# Patient Record
Sex: Female | Born: 1960 | Race: White | Hispanic: No | Marital: Married | Smoking: Current every day smoker
Health system: Southern US, Community
[De-identification: ages and names within clinical notes are randomized; demographics above are authoritative.]

## PROBLEM LIST (undated history)

## (undated) DIAGNOSIS — Z86010 Personal history of colonic polyps: Secondary | ICD-10-CM

## (undated) DIAGNOSIS — N390 Urinary tract infection, site not specified: Secondary | ICD-10-CM

## (undated) DIAGNOSIS — E785 Hyperlipidemia, unspecified: Secondary | ICD-10-CM

## (undated) DIAGNOSIS — B019 Varicella without complication: Secondary | ICD-10-CM

## (undated) DIAGNOSIS — F419 Anxiety disorder, unspecified: Secondary | ICD-10-CM

## (undated) HISTORY — PX: TUBAL LIGATION: SHX77

## (undated) HISTORY — PX: COLONOSCOPY W/ POLYPECTOMY: SHX1380

## (undated) HISTORY — DX: Personal history of colonic polyps: Z86.010

## (undated) HISTORY — PX: WISDOM TOOTH EXTRACTION: SHX21

## (undated) HISTORY — DX: Anxiety disorder, unspecified: F41.9

## (undated) HISTORY — DX: Hyperlipidemia, unspecified: E78.5

## (undated) HISTORY — DX: Urinary tract infection, site not specified: N39.0

## (undated) HISTORY — PX: OTHER SURGICAL HISTORY: SHX169

## (undated) HISTORY — DX: Varicella without complication: B01.9

---

## 1999-07-14 ENCOUNTER — Encounter: Payer: Self-pay | Admitting: Obstetrics and Gynecology

## 1999-07-14 ENCOUNTER — Encounter: Admission: RE | Admit: 1999-07-14 | Discharge: 1999-07-14 | Payer: Self-pay | Admitting: Obstetrics and Gynecology

## 2005-03-16 ENCOUNTER — Encounter: Admission: RE | Admit: 2005-03-16 | Discharge: 2005-03-16 | Payer: Self-pay | Admitting: Obstetrics and Gynecology

## 2011-07-02 ENCOUNTER — Encounter: Payer: Self-pay | Admitting: Internal Medicine

## 2011-07-02 ENCOUNTER — Ambulatory Visit (INDEPENDENT_AMBULATORY_CARE_PROVIDER_SITE_OTHER): Payer: 59 | Admitting: Internal Medicine

## 2011-07-02 VITALS — BP 128/80 | HR 82 | Temp 98.0°F | Wt 137.0 lb

## 2011-07-02 DIAGNOSIS — Z0000 Encounter for general adult medical examination without abnormal findings: Secondary | ICD-10-CM

## 2011-07-02 NOTE — Progress Notes (Signed)
  Subjective:    Patient ID: Sheila Harrington, female    DOB: [birth year], 50 y.o.   MRN: [id number]  HPI Sheila Harrington presents to establish for on-going continuity. Her chief concern is that she had an NMR lipoprofile and she needs this to be interpreted. She is otherwise feeling well. She has not had any major medical illness, injury or other acute problems.  Past Medical History  Diagnosis Date  . Varicella   . Chronic UTI    Past Surgical History  Procedure Date  . Tubal ligation   . G2p2    Family History  Problem Relation Age of Onset  . Cancer Mother     breast; thyroid  . Stroke Mother   . Hyperlipidemia Mother   . Diabetes Neg Hx   . Heart disease Neg Hx    History   Social History  . Marital Status: Married    Spouse Name: N/A    Number of Children: 2  . Years of Education: 12   Occupational History  . medicaid worker    Social History Main Topics  . Smoking status: Current Everyday Smoker -- 0.5 packs/day for 10 years  . Smokeless tobacco: Not on file   Comment: advised taht it is bad  . Alcohol Use: No  . Drug Use: No  . Sexually Active: Yes -- Female partner(s)   Other Topics Concern  . Not on file   Social History Narrative   HSG, on the job training. Married - 1986 - 2 dtrs' 89, '93. 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 Marriage is in good health       Review of Systems Constitutional:  Negative for fever, chills, activity change and unexpected weight change.  HEENT:  Negative for hearing loss, ear pain, congestion, neck stiffness and postnasal drip. Negative for sore throat or swallowing problems. Negative for dental complaints.   Eyes: Negative for vision loss or change in visual acuity.  Respiratory: Negative for chest tightness and wheezing. Negative for DOE.   Cardiovascular: Negative for chest pain or palpitations. No decreased exercise tolerance Gastrointestinal: No change in bowel habit. No bloating or gas. No reflux  or indigestion Genitourinary: Negative for urgency, frequency, flank pain and difficulty urinating.  Musculoskeletal: Negative for myalgias, back pain, arthralgias and gait problem.  Neurological: Negative for dizziness, tremors, weakness and headaches.  Hematological: Negative for adenopathy.  Psychiatric/Behavioral: Negative for behavioral problems and dysphoric mood.      Objective:   Physical Exam Vitals reviewed - normal with excellent blood pressure Gen'l - well nourished and developed white woman in no distress HEENT- C&S clear - oropharynx normal Chest - good breath sounds Cor - 2+ radial pulse, RRR no murmurs Abdomen - BS+, Soft. Extremities - normal Neuro - CN II-XII grossly intact, normal gait.       Assessment & Plan:

## 2011-07-04 DIAGNOSIS — Z0000 Encounter for general adult medical examination without abnormal findings: Secondary | ICD-10-CM | POA: Insufficient documentation

## 2011-07-04 NOTE — Assessment & Plan Note (Addendum)
History is unremarkable except for concern about cholesterol. On the lipo profile the HDL is normal and the LDL is low. The remainder is not consequential in the setting of normal main indices in a patient with low to moderate risk. She is current with her gynecologist. She is current with mammography. She is a candidate for routine colonoscopy or if done she will need to forward a copy of the report. She has had recent outside labs- she will obtain copies and additional labs will be ordered as needed.  In summary - a very nice woman who seems medically stable. She will stop simvastatin and her lipid profile will be rechecked in 6 months to insure normal readings. She is advised that if her PAP smears have been normal that she can safely have follow-up pelvic and PAP at a 2 or even 3 year interval. She will return at her convenience for a more thorough exam. She is oriented to the practice and processes. All questions answered.

## 2011-08-23 ENCOUNTER — Telehealth: Payer: Self-pay | Admitting: *Deleted

## 2011-08-23 DIAGNOSIS — N3000 Acute cystitis without hematuria: Secondary | ICD-10-CM

## 2011-08-23 NOTE — Telephone Encounter (Signed)
Pt informed

## 2011-08-23 NOTE — Telephone Encounter (Signed)
Pt c/o having "bad kidney infection"; requesting Bactrim Rx.

## 2011-08-23 NOTE — Telephone Encounter (Signed)
Come down now for stat u/a - 595.0. Treatment to be based on results

## 2011-08-31 ENCOUNTER — Other Ambulatory Visit (INDEPENDENT_AMBULATORY_CARE_PROVIDER_SITE_OTHER): Payer: 59

## 2011-08-31 DIAGNOSIS — N3000 Acute cystitis without hematuria: Secondary | ICD-10-CM

## 2011-08-31 LAB — URINALYSIS, ROUTINE W REFLEX MICROSCOPIC
Bilirubin Urine: NEGATIVE
Hgb urine dipstick: NEGATIVE
Ketones, ur: NEGATIVE
Leukocytes, UA: NEGATIVE
Nitrite: NEGATIVE
Specific Gravity, Urine: <=1.005 (ref 1.000–1.030)
Total Protein, Urine: NEGATIVE
Urine Glucose: NEGATIVE
Urobilinogen, UA: 0.2 (ref 0.0–1.0)
pH: 7 (ref 5.0–8.0)

## 2011-08-31 NOTE — Telephone Encounter (Signed)
Pt informed U/A clear.

## 2012-01-25 ENCOUNTER — Other Ambulatory Visit: Payer: Self-pay

## 2012-01-25 DIAGNOSIS — Z13220 Encounter for screening for lipoid disorders: Secondary | ICD-10-CM

## 2012-02-01 ENCOUNTER — Other Ambulatory Visit (INDEPENDENT_AMBULATORY_CARE_PROVIDER_SITE_OTHER): Payer: 59

## 2012-02-01 DIAGNOSIS — Z13220 Encounter for screening for lipoid disorders: Secondary | ICD-10-CM

## 2012-02-01 LAB — LIPID PANEL
Cholesterol: 226 mg/dL — ABNORMAL HIGH (ref 0–200)
HDL: 42.6 mg/dL (ref >39.00)
Total CHOL/HDL Ratio: 5
Triglycerides: 90 mg/dL (ref 0.0–149.0)
VLDL: 18 mg/dL (ref 0.0–40.0)

## 2012-02-01 LAB — LDL CHOLESTEROL, DIRECT: Direct LDL: 177.3 mg/dL

## 2012-02-11 ENCOUNTER — Encounter: Payer: Self-pay | Admitting: Internal Medicine

## 2012-02-18 ENCOUNTER — Telehealth: Payer: Self-pay

## 2012-02-18 MED ORDER — SIMVASTATIN 20 MG PO TABS: 20.0000 mg | ORAL_TABLET | Freq: Every day | ORAL | Status: DC

## 2012-02-18 MED ORDER — ZOLPIDEM TARTRATE 5 MG PO TABS: 5.0000 mg | ORAL_TABLET | Freq: Every evening | ORAL | Status: DC | PRN

## 2012-02-18 NOTE — Telephone Encounter (Signed)
1. For sleep - zolpidem 5 mg qhs every 2nd or third night #20. For continued problems - OV 2. Last LDL 177 - did she receive letter? (5/27)  Has she resumed zocor as instructed in that letter.

## 2012-02-18 NOTE — Telephone Encounter (Signed)
Pt called requesting Rx to help with sleep. Pt states recent increase of work related stress is the cause, please advise.

## 2012-02-18 NOTE — Telephone Encounter (Signed)
Notified pt with md response & recommendations. Called ambien into [facility], already sent zocor... 02/18/12@2 :02pm/LMB

## 2012-04-14 ENCOUNTER — Other Ambulatory Visit: Payer: Self-pay | Admitting: Internal Medicine

## 2012-04-15 NOTE — Telephone Encounter (Signed)
Rx printed and to be signed and faxed for [employer]

## 2012-04-28 ENCOUNTER — Encounter: Payer: Self-pay | Admitting: Internal Medicine

## 2012-07-28 ENCOUNTER — Telehealth: Payer: Self-pay

## 2012-07-28 NOTE — Telephone Encounter (Signed)
A user error has taken place: encounter opened in error, closed for administrative reasons.

## 2012-08-20 ENCOUNTER — Other Ambulatory Visit: Payer: Self-pay | Admitting: Internal Medicine

## 2012-10-08 ENCOUNTER — Other Ambulatory Visit (INDEPENDENT_AMBULATORY_CARE_PROVIDER_SITE_OTHER): Payer: PRIVATE HEALTH INSURANCE

## 2012-10-08 ENCOUNTER — Ambulatory Visit (INDEPENDENT_AMBULATORY_CARE_PROVIDER_SITE_OTHER): Payer: PRIVATE HEALTH INSURANCE | Admitting: Internal Medicine

## 2012-10-08 ENCOUNTER — Encounter: Payer: Self-pay | Admitting: Internal Medicine

## 2012-10-08 VITALS — BP 110/72 | HR 96 | Temp 97.6°F | Resp 10 | Ht 65.0 in | Wt 140.1 lb

## 2012-10-08 DIAGNOSIS — Z0000 Encounter for general adult medical examination without abnormal findings: Secondary | ICD-10-CM

## 2012-10-08 DIAGNOSIS — E785 Hyperlipidemia, unspecified: Secondary | ICD-10-CM | POA: Insufficient documentation

## 2012-10-08 DIAGNOSIS — Z1211 Encounter for screening for malignant neoplasm of colon: Secondary | ICD-10-CM

## 2012-10-08 LAB — LIPID PANEL
Cholesterol: 157 mg/dL (ref 0–200)
HDL: 41.4 mg/dL (ref >39.00)
VLDL: 19.4 mg/dL (ref 0.0–40.0)

## 2012-10-08 LAB — COMPREHENSIVE METABOLIC PANEL
Alkaline Phosphatase: 53 U/L (ref 39–117)
BUN: 11 mg/dL (ref 6–23)
Glucose, Bld: 91 mg/dL (ref 70–99)
Total Bilirubin: 0.8 mg/dL (ref 0.3–1.2)

## 2012-10-08 LAB — HEPATIC FUNCTION PANEL
ALT: 18 U/L (ref 0–35)
AST: 19 U/L (ref 0–37)
Bilirubin, Direct: 0.1 mg/dL (ref 0.0–0.3)
Total Bilirubin: 0.8 mg/dL (ref 0.3–1.2)

## 2012-10-08 NOTE — Progress Notes (Signed)
Subjective:    Patient ID: Sheila Harrington, female    DOB: 1961/08/06, 52 y.o.   MRN: [id number]  HPI Sheila Harrington presents for a well woman exam. She had a PAP in '13 and is due '16. She had a mammogram in late '13. She has had tetanus/Tdap and flu shot. She is due for routine dental exam. She had eye exam last year. Exercise - will walk at work for 30 minutes in good weather.  Past Medical History  Diagnosis Date  . Varicella   . Chronic UTI    Past Surgical History  Procedure Date  . Tubal ligation   . G2p2    Family History  Problem Relation Age of Onset  . Cancer Mother     breast; thyroid  . Stroke Mother   . Hyperlipidemia Mother   . Diabetes Neg Hx   . Heart disease Neg Hx    History   Social History  . Marital Status: Married    Spouse Name: N/A    Number of Children: 2  . Years of Education: 12   Occupational History  . medicaid worker    Social History Main Topics  . Smoking status: Current Every Day Smoker -- 0.5 packs/day for 10 years  . Smokeless tobacco: Not on file     Comment: advised taht it is bad  . Alcohol Use: No  . Drug Use: No  . Sexually Active: Yes -- Female partner(s)   Other Topics Concern  . Not on file   Social History Narrative   HSG, on the job training. Married - 1986 - 2 dtrs' 89, '93. 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 Marriage is in good health    Current Outpatient Prescriptions on File Prior to Visit  Medication Sig Dispense Refill  . AMBIEN 5 MG tablet TAKE 1 TABLET BY MOUTH AT BEDTIME AS NEEDED FOR SLEEP  30 each  3  . simvastatin (ZOCOR) 20 MG tablet TAKE 1 TABLET BY MOUTH AT BEDTIME.  30 tablet  2      Review of Systems Constitutional:  Negative for fever, chills, activity change and unexpected weight change.  HEENT:  Negative for hearing loss, ear pain, congestion, neck stiffness and postnasal drip. Negative for sore throat or swallowing problems. Negative for dental complaints.   Eyes:  Negative for vision loss or change in visual acuity.  Respiratory: Negative for chest tightness and wheezing. Negative for DOE.   Cardiovascular: Negative for chest pain or palpitations. No decreased exercise tolerance Gastrointestinal: No change in bowel habit. No bloating or gas. No reflux or indigestion Genitourinary: Negative for urgency, frequency, flank pain and difficulty urinating.  Musculoskeletal: Negative for myalgias, back pain, arthralgias and gait problem.  Neurological: Negative for dizziness, tremors, weakness and headaches.  Hematological: Negative for adenopathy.  Psychiatric/Behavioral: Negative for behavioral problems and dysphoric mood.       Objective:   Physical Exam Filed Vitals:   10/08/12 1317  BP: 110/72  Pulse: 96  Temp: 97.6 F (36.4 C)  Resp: 10   Wt Readings from Last 3 Encounters:  10/08/12 140 lb 1.3 oz (63.54 kg)  07/02/11 137 lb (62.143 kg)   Gen'l: well nourished, well developed white Woman in no distress HEENT - [place]/AT, EACs/TMs normal, oropharynx with native dentition in good condition, no buccal or palatal lesions, posterior pharynx clear, mucous membranes moist. C&S clear, PERRLA, fundi - normal Neck - supple, no thyromegaly Nodes- negative submental, cervical, supraclavicular regions Chest -  no deformity, no CVAT Lungs - clear without rales, wheezes. No increased work of breathing Breast - - Skin normal, nipples w/o discharge, no fixed mass or lesion, no axillary adenopathy. Cardiovascular - regular rate and rhythm, quiet precordium, no murmurs, rubs or gallops, 2+ radial, DP and PT pulses Abdomen - BS+ x 4, no HSM, no guarding or rebound or tenderness Pelvic - deferred to gyn Rectal - deferred to gyn Extremities - no clubbing, cyanosis, edema or deformity.  Neuro - A&O x 3, CN II-XII normal, motor strength normal and equal, DTRs 2+ and symmetrical biceps, radial, and patellar tendons. Cerebellar - no tremor, no rigidity, fluid movement  and normal gait. Derm - Head, neck, back, abdomen and extremities without suspicious lesions  Lab Results  Component Value Date   GLUCOSE 91 10/08/2012   CHOL 157 10/08/2012   TRIG 97.0 10/08/2012   HDL 41.40 10/08/2012   LDLDIRECT 177.3 02/01/2012   LDLCALC 96 10/08/2012        ALT 18 10/08/2012   AST 19 10/08/2012        NA 138 10/08/2012   K 3.9 10/08/2012   CL 104 10/08/2012   CREATININE 0.7 10/08/2012   BUN 11 10/08/2012   CO2 26 10/08/2012         Assessment & Plan:

## 2012-10-08 NOTE — Patient Instructions (Addendum)
Thanks for coming to see me.  Stress relief: exercise is really good; the old radiator hose and the phone book really works. For stress that is interfering with the quality of your life and relationships counseling may help.  You will receive a full report including labs and you may check lab results on MyChart.  See you next year.

## 2012-10-10 ENCOUNTER — Encounter: Payer: Self-pay | Admitting: Internal Medicine

## 2012-10-11 NOTE — Assessment & Plan Note (Signed)
Interval history - no serious medical illness, injury or surgery. Physical exam - normal. She is due and will be scheduled for colorectal cancer screening. She is current with breast cancer screening. Tdap recommended  In summary - a very pleasant woman who is medically stable. She will return for routine exam in 1 year.

## 2012-10-11 NOTE — Assessment & Plan Note (Signed)
Lipid panel demonstrating great control: LDL down from 177 to 96. Liver functions are normal.  Plan   continue simvastatin.

## 2012-10-24 ENCOUNTER — Encounter: Payer: Self-pay | Admitting: Internal Medicine

## 2012-10-24 ENCOUNTER — Ambulatory Visit (AMBULATORY_SURGERY_CENTER): Payer: PRIVATE HEALTH INSURANCE

## 2012-10-24 VITALS — Ht 65.0 in | Wt 145.0 lb

## 2012-10-24 DIAGNOSIS — Z1211 Encounter for screening for malignant neoplasm of colon: Secondary | ICD-10-CM

## 2012-10-24 MED ORDER — NA SULFATE-K SULFATE-MG SULF 17.5-3.13-1.6 GM/177ML PO SOLN: 1.0000 | Freq: Once | ORAL | Status: DC

## 2012-11-11 ENCOUNTER — Telehealth: Payer: Self-pay | Admitting: Internal Medicine

## 2012-11-11 NOTE — Telephone Encounter (Signed)
Spoke with patient and informed her that [employer] would have to order the Suprep bowel prep and she could pick it up tomorrow, or she could come by our office and pick up the sample of Suprep  today. The patient's procedure is scheduled for 11/13/12. The patient will send her daughter to pick up the Suprep prep today due to the patient lives in [place] and unable to be here before we close.She will call if she has further questions.

## 2012-11-13 ENCOUNTER — Encounter: Payer: Self-pay | Admitting: Internal Medicine

## 2012-11-13 ENCOUNTER — Ambulatory Visit (AMBULATORY_SURGERY_CENTER): Payer: PRIVATE HEALTH INSURANCE | Admitting: Internal Medicine

## 2012-11-13 VITALS — BP 116/77 | HR 79 | Temp 97.8°F | Resp 16 | Ht 65.0 in | Wt 145.0 lb

## 2012-11-13 DIAGNOSIS — K5730 Diverticulosis of large intestine without perforation or abscess without bleeding: Secondary | ICD-10-CM

## 2012-11-13 DIAGNOSIS — Z1211 Encounter for screening for malignant neoplasm of colon: Secondary | ICD-10-CM

## 2012-11-13 DIAGNOSIS — D126 Benign neoplasm of colon, unspecified: Secondary | ICD-10-CM

## 2012-11-13 MED ORDER — SODIUM CHLORIDE 0.9 % IV SOLN: 500.0000 mL | INTRAVENOUS | Status: DC

## 2012-11-13 NOTE — Patient Instructions (Addendum)
I found and removed 3 polyps from the colon and rectum today. They all look benign - i will send them to pathology to find out for sure.  You also have diverticulosis - a common condition not usually a problem.  I will let you know pathology results and when to have another routine colonoscopy by mail.  Thank you for choosing me and [facility].  Iva Boop, MD, FACG  YOU HAD AN ENDOSCOPIC PROCEDURE TODAY AT THE [facility]: Refer to the procedure report that was given to you for any specific questions about what was found during the examination.  If the procedure report does not answer your questions, please call your gastroenterologist to clarify.  If you requested that your care partner not be given the details of your procedure findings, then the procedure report has been included in a sealed envelope for you to review at your convenience later.  YOU SHOULD EXPECT: Some feelings of bloating in the abdomen. Passage of more gas than usual.  Walking can help get rid of the air that was put into your GI tract during the procedure and reduce the bloating. If you had a lower endoscopy (such as a colonoscopy or flexible sigmoidoscopy) you may notice spotting of blood in your stool or on the toilet paper. If you underwent a bowel prep for your procedure, then you may not have a normal bowel movement for a few days.  DIET: Your first meal following the procedure should be a light meal and then it is ok to progress to your normal diet.  A half-sandwich or bowl of soup is an example of a good first meal.  Heavy or fried foods are harder to digest and may make you feel nauseous or bloated.  Likewise meals heavy in dairy and vegetables can cause extra gas to form and this can also increase the bloating.  Drink plenty of fluids but you should avoid alcoholic beverages for 24 hours.  ACTIVITY: Your care partner should take you home directly after the procedure.  You should plan to  take it easy, moving slowly for the rest of the day.  You can resume normal activity the day after the procedure however you should NOT DRIVE or use heavy machinery for 24 hours (because of the sedation medicines used during the test).    SYMPTOMS TO REPORT IMMEDIATELY: A gastroenterologist can be reached at any hour.  During normal business hours, 8:30 AM to 5:00 PM Monday through Friday, call [phone].  After hours and on weekends, please call the GI answering service at [phone] who will take a message and have the physician on call contact you.   Following lower endoscopy (colonoscopy or flexible sigmoidoscopy):  Excessive amounts of blood in the stool  Significant tenderness or worsening of abdominal pains  Swelling of the abdomen that is new, acute  Fever of 100F or higher  FOLLOW UP: If any biopsies were taken you will be contacted by phone or by letter within the next 1-3 weeks.  Call your gastroenterologist if you have not heard about the biopsies in 3 weeks.  Our staff will call the home number listed on your records the next business day following your procedure to check on you and address any questions or concerns that you may have at that time regarding the information given to you following your procedure. This is a courtesy call and so if there is no answer at the home number and we have  not heard from you through the emergency physician on call, we will assume that you have returned to your regular daily activities without incident.  SIGNATURES/CONFIDENTIALITY: You and/or your care partner have signed paperwork which will be entered into your electronic medical record.  These signatures attest to the fact that that the information above on your After Visit Summary has been reviewed and is understood.  Full responsibility of the confidentiality of this discharge information lies with you and/or your care-partner.  Polyps-handout given  Diverticulosis-handout  given  Repeat colonoscopy will be determined by pathology  Hold aspirin, aspirin products, and anti-inflammatory (advil, motrin, Ibuprofen, naproxen) medication for 1 week

## 2012-11-13 NOTE — Op Note (Signed)
[facility] [address] [place], [address]   COLONOSCOPY PROCEDURE REPORT  PATIENT: Sheila, Harrington  MR#: [id number] BIRTHDATE: 10-Oct-1960 , 51  yrs. old GENDER: Female ENDOSCOPIST: Iva Boop, MD, [facility] REFERRED XB:[id number] Esther Hardy, M.D. PROCEDURE DATE:  11/13/2012 PROCEDURE:   Colonoscopy with snare polypectomy ASA CLASS:   Class I INDICATIONS:average risk screening. MEDICATIONS: propofol (Diprivan) 300mg  IV, MAC sedation, administered by CRNA, and These medications were titrated to patient response per physician's verbal order  DESCRIPTION OF PROCEDURE:   After the risks benefits and alternatives of the procedure were thoroughly explained, informed consent was obtained.  A digital rectal exam revealed no abnormalities of the rectum.   The LB [id number]  endoscope was introduced through the anus and advanced to the cecum, which was identified by both the appendix and ileocecal valve. No adverse events experienced.   The quality of the prep was Suprep good  The instrument was then slowly withdrawn as the colon was fully examined.      COLON FINDINGS: A polypoid shaped and smooth sessile polyp measuring 1 cm in size was found in the sigmoid colon.  A polypectomy was performed using snare cautery.  The resection was complete and the polyp tissue was completely retrieved.   Two diminutive polypoid shaped and smooth sessile polyps were found.  A polypectomy was performed with a cold snare.  The resection was complete and the polyp tissue was completely retrieved.   There was severe diverticulosis noted in the sigmoid colon with associated muscular hypertrophy and luminal narrowing.   Mild diverticulosis was noted in the ascending colon.   The colon mucosa was otherwise normal. A right colon retroflexion was performed.  Retroflexed views revealed no abnormalities. The time to cecum=4 minutes 70 seconds. Withdrawal time=12 minutes 18 seconds.   The scope was withdrawn and the procedure completed. COMPLICATIONS: There were no complications.  ENDOSCOPIC IMPRESSION: 1.   Sessile polyp measuring 1 cm in size was found in the sigmoid colon; polypectomy was performed using snare cautery 2.   Two diminutive sessile polyps were found; polypectomy was performed with a cold snare 3.   There was severe diverticulosis noted in the sigmoid colon 4.   Mild diverticulosis was noted in the ascending colon 5.   The colon mucosa was otherwise normal - good prep     RECOMMENDATIONS: 1.  Timing of repeat colonoscopy will be determined by pathology findings. 2.  Hold aspirin, aspirin products, and anti-inflammatory medication for 1 week.   eSigned:  Iva Boop, MD, [facility] 11/13/2012 10:49 AM   cc: Jacques Navy, MD and The Patient   PATIENT NAME:  Sheila, Harrington MR#: [id number]

## 2012-11-13 NOTE — Progress Notes (Signed)
Patient did not experience any of the following events: a burn prior to discharge; a fall within the facility; wrong site/side/patient/procedure/implant event; or a hospital transfer or hospital admission upon discharge from the facility. (G8907) Patient did not have preoperative order for IV antibiotic SSI prophylaxis. (G8918)  

## 2012-11-13 NOTE — Progress Notes (Signed)
Called to room to assist during endoscopic procedure.  Patient ID and intended procedure confirmed with present staff. Received instructions for my participation in the procedure from the performing physician.  

## 2012-11-14 ENCOUNTER — Telehealth: Payer: Self-pay | Admitting: *Deleted

## 2012-11-14 NOTE — Telephone Encounter (Signed)
Left message that we called for f/u 

## 2012-11-19 ENCOUNTER — Other Ambulatory Visit: Payer: Self-pay | Admitting: Internal Medicine

## 2012-11-20 ENCOUNTER — Encounter: Payer: Self-pay | Admitting: Internal Medicine

## 2012-11-20 DIAGNOSIS — Z860100 Personal history of colon polyps, unspecified: Secondary | ICD-10-CM

## 2012-11-20 HISTORY — DX: Personal history of colon polyps, unspecified: Z86.0100

## 2012-11-20 HISTORY — DX: Personal history of colonic polyps: Z86.010

## 2012-11-20 NOTE — Progress Notes (Signed)
Quick Note:  1 cm sigmoid adenoma 2 rectal hyperplastic polyps Repeat colon 3 yrs 11/2015 ______

## 2012-12-30 ENCOUNTER — Other Ambulatory Visit: Payer: Self-pay | Admitting: Dermatology

## 2013-02-23 ENCOUNTER — Telehealth: Payer: Self-pay | Admitting: *Deleted

## 2013-02-23 NOTE — Telephone Encounter (Signed)
Notified pt wit md response. Pt states she will call back once she get her schedule & arrange appt...lmb

## 2013-02-23 NOTE — Telephone Encounter (Signed)
Will need office visit

## 2013-02-23 NOTE — Telephone Encounter (Signed)
Left msg on triage requesting md to rx hormone replacement...Sheila Harrington

## 2013-04-15 ENCOUNTER — Other Ambulatory Visit: Payer: Self-pay | Admitting: Internal Medicine

## 2013-04-15 NOTE — Telephone Encounter (Signed)
Zolpidem called to pharmacy  

## 2013-04-17 DEATH — deceased

## 2013-07-15 ENCOUNTER — Encounter (INDEPENDENT_AMBULATORY_CARE_PROVIDER_SITE_OTHER): Payer: Self-pay

## 2013-07-15 ENCOUNTER — Encounter: Payer: Self-pay | Admitting: Nurse Practitioner

## 2013-07-15 ENCOUNTER — Ambulatory Visit (INDEPENDENT_AMBULATORY_CARE_PROVIDER_SITE_OTHER): Payer: PRIVATE HEALTH INSURANCE | Admitting: Nurse Practitioner

## 2013-07-15 VITALS — BP 133/84 | HR 91 | Temp 98.5°F | Ht 64.0 in | Wt 146.0 lb

## 2013-07-15 DIAGNOSIS — Z124 Encounter for screening for malignant neoplasm of cervix: Secondary | ICD-10-CM

## 2013-07-15 DIAGNOSIS — Z23 Encounter for immunization: Secondary | ICD-10-CM

## 2013-07-15 DIAGNOSIS — Z0000 Encounter for general adult medical examination without abnormal findings: Secondary | ICD-10-CM

## 2013-07-15 DIAGNOSIS — Z01419 Encounter for gynecological examination (general) (routine) without abnormal findings: Secondary | ICD-10-CM

## 2013-07-15 LAB — POCT UA - MICROSCOPIC ONLY
Casts, Ur, LPF, POC: NEGATIVE
Crystals, Ur, HPF, POC: NEGATIVE

## 2013-07-15 LAB — POCT CBC
Granulocyte percent: 56.9 %G (ref 37–80)
Lymph, poc: 3.3 (ref 0.6–3.4)
MCH, POC: 29 pg (ref 27–31.2)
MCV: 88.7 fL (ref 80–97)
POC Granulocyte: 4.5 (ref 2–6.9)
Platelet Count, POC: 214 10*3/uL (ref 142–424)
RBC: 4.7 M/uL (ref 4.04–5.48)
RDW, POC: 12.5 %
WBC: 7.9 10*3/uL (ref 4.6–10.2)

## 2013-07-15 LAB — POCT URINALYSIS DIPSTICK
Bilirubin, UA: NEGATIVE
Ketones, UA: NEGATIVE
Protein, UA: NEGATIVE
Spec Grav, UA: 1.02
pH, UA: 6

## 2013-07-15 MED ORDER — ESCITALOPRAM OXALATE 10 MG PO TABS: 10.0000 mg | ORAL_TABLET | Freq: Every day | ORAL | Status: DC

## 2013-07-15 MED ORDER — ZOLPIDEM TARTRATE 5 MG PO TABS: 5.0000 mg | ORAL_TABLET | Freq: Every evening | ORAL | Status: DC | PRN

## 2013-07-15 NOTE — Progress Notes (Signed)
Subjective:    Patient ID: Sheila Harrington, female    DOB: 10/15/1960, 52 y.o.   MRN: [id number]  HPI Patient in today for CPE and PAP- SHE is doing well today- Only complaint is stress at work wit new computer system. Patient Active Problem List   Diagnosis Date Noted  . Personal history of colonic adenoma 11/20/2012  . Hyperlipidemia 10/08/2012  . Routine health maintenance 07/04/2011   Outpatient Encounter Prescriptions as of 07/15/2013  Medication Sig Dispense Refill  . simvastatin (ZOCOR) 20 MG tablet TAKE 1 TABLET BY MOUTH AT BEDTIME.  30 tablet  9  . zolpidem (AMBIEN) 5 MG tablet TAKE 1 TABLET BY MOUTH AT BEDTIME AS NEEDED FOR SLEEP  30 tablet  5   No facility-administered encounter medications on file as of 07/15/2013.       Review of Systems  Constitutional: Negative.   HENT: Negative.   Eyes: Negative.   Respiratory: Negative.   Cardiovascular: Negative.   Gastrointestinal: Negative.   Genitourinary: Negative.   Musculoskeletal: Negative.   Neurological: Negative.   Hematological: Negative.   Psychiatric/Behavioral: Negative.   All other systems reviewed and are negative.       Objective:   Physical Exam  Constitutional: She is oriented to person, place, and time. She appears well-developed and well-nourished.  HENT:  Head: Normocephalic.  Right Ear: Hearing, tympanic membrane, external ear and ear canal normal.  Left Ear: Hearing, tympanic membrane, external ear and ear canal normal.  Nose: Nose normal.  Mouth/Throat: Uvula is midline and oropharynx is clear and moist.  Eyes: Conjunctivae and EOM are normal. Pupils are equal, round, and reactive to light.  Neck: Normal range of motion and full passive range of motion without pain. Neck supple. No JVD present. Carotid bruit is not present. No mass and no thyromegaly present.  Cardiovascular: Normal rate, normal heart sounds and intact distal pulses.   No murmur heard. Pulmonary/Chest: Effort normal and  breath sounds normal. Right breast exhibits no inverted nipple, no mass, no nipple discharge, no skin change and no tenderness. Left breast exhibits no inverted nipple, no mass, no nipple discharge, no skin change and no tenderness.  Abdominal: Soft. Bowel sounds are normal. She exhibits no mass. There is no tenderness.  Genitourinary: Vagina normal and uterus normal. No breast swelling, tenderness, discharge or bleeding.  bimanual exam-No adnexal masses or tenderness. Cervix parous and pink- no discharge  Musculoskeletal: Normal range of motion.  Lymphadenopathy:    She has no cervical adenopathy.  Neurological: She is alert and oriented to person, place, and time.  Skin: Skin is warm and dry.  Psychiatric: She has a normal mood and affect. Her behavior is normal. Judgment and thought content normal.  BP 133/84  Pulse 91  Temp(Src) 98.5 F (36.9 C) (Oral)  Ht 5\' 4"  (1.626 m)  Wt 146 lb (66.225 kg)  BMI 25.05 kg/m2 Results for orders placed in visit on 07/15/13  POCT UA - MICROSCOPIC ONLY      Result Value Range   WBC, Ur, HPF, POC 4-7     RBC, urine, microscopic 5-10     Bacteria, U Microscopic many     Mucus, UA mod     Epithelial cells, urine per micros few     Crystals, Ur, HPF, POC neg     Casts, Ur, LPF, POC neg     Yeast, UA neg    POCT URINALYSIS DIPSTICK      Result Value Range   Color,  UA yellow     Clarity, UA clear     Glucose, UA neg     Bilirubin, UA neg     Ketones, UA neg     Spec Grav, UA 1.020     Blood, UA trace     pH, UA 6.0     Protein, UA neg     Urobilinogen, UA negative     Nitrite, UA +     Leukocytes, UA Trace             Assessment & Plan:   1. Encounter for routine gynecological examination   2. Annual physical exam    Orders Placed This Encounter  Procedures  . CMP14+EGFR  . NMR, lipoprofile  . Thyroid Panel With TSH  . POCT UA - Microscopic Only  . POCT urinalysis dipstick  . POCT CBC   Meds ordered this encounter   Medications  . zolpidem (AMBIEN) 5 MG tablet    Sig: Take 1 tablet (5 mg total) by mouth at bedtime as needed for sleep.    Dispense:  30 tablet    Refill:  2    Order Specific Question:  Supervising Provider    Answer:  Ernestina Penna [[id number]]  . escitalopram (LEXAPRO) 10 MG tablet    Sig: Take 1 tablet (10 mg total) by mouth daily.    Dispense:  30 tablet    Refill:  5    Order Specific Question:  Supervising Provider    Answer:  Deborra Medina   Added lexapro to meds Stress management discussed Continue all meds Labs pending Diet and exercise encouraged Health maintenance reviewed Follow up in 6 months  Mary-Margaret Daphine Deutscher, FNP

## 2013-07-15 NOTE — Addendum Note (Signed)
Addended by: Roselyn Reef on: 07/15/2013 05:25 PM   Modules accepted: Orders

## 2013-07-15 NOTE — Patient Instructions (Addendum)
Stress Management Stress is a state of physical or mental tension that often results from changes in your life or normal routine. Some common causes of stress are:  Death of a loved one.  Injuries or severe illnesses.  Getting fired or changing jobs.  Moving into a new home. Other causes may be:  Sexual problems.  Business or financial losses.  Taking on a large debt.  Regular conflict with someone at home or at work.  Constant tiredness from lack of sleep. It is not just bad things that are stressful. It may be stressful to:  Win the lottery.  Get married.  Buy a new car. The amount of stress that can be easily tolerated varies from person to person. Changes generally cause stress, regardless of the types of change. Too much stress can affect your health. It may lead to physical or emotional problems. Too little stress (boredom) may also become stressful. SUGGESTIONS TO REDUCE STRESS:  Talk things over with your family and friends. It often is helpful to share your concerns and worries. If you feel your problem is serious, you may want to get help from a professional counselor.  Consider your problems one at a time instead of lumping them all together. Trying to take care of everything at once may seem impossible. List all the things you need to do and then start with the most important one. Set a goal to accomplish 2 or 3 things each day. If you expect to do too many in a single day you will naturally fail, causing you to feel even more stressed.  Do not use alcohol or drugs to relieve stress. Although you may feel better for a short time, they do not remove the problems that caused the stress. They can also be habit forming.  Exercise regularly - at least 3 times per week. Physical exercise can help to relieve that "uptight" feeling and will relax you.  The shortest distance between despair and hope is often a good night's sleep.  Go to bed and get up on time allowing  yourself time for appointments without being rushed.  Take a short "time-out" period from any stressful situation that occurs during the day. Close your eyes and take some deep breaths. Starting with the muscles in your face, tense them, hold it for a few seconds, then relax. Repeat this with the muscles in your neck, shoulders, hand, stomach, back and legs.  Take good care of yourself. Eat a balanced diet and get plenty of rest.  Schedule time for having fun. Take a break from your daily routine to relax. HOME CARE INSTRUCTIONS   Call if you feel overwhelmed by your problems and feel you can no longer manage them on your own.  Return immediately if you feel like hurting yourself or someone else. Document Released: 02/27/2001 Document Revised: 11/26/2011 Document Reviewed: 10/20/2007 [employer] Patient Information 2014 [employer].  

## 2013-07-17 LAB — NMR, LIPOPROFILE
HDL Cholesterol by NMR: 41 mg/dL (ref >=40)
HDL Particle Number: 32 umol/L (ref >=30.5)
LDL Size: 20.1 nm — ABNORMAL LOW (ref >20.5)
Small LDL Particle Number: 1167 nmol/L — ABNORMAL HIGH (ref <=527)
Triglycerides by NMR: 136 mg/dL (ref <150)

## 2013-07-17 LAB — CMP14+EGFR
ALT: 13 IU/L (ref 0–32)
AST: 13 IU/L (ref 0–40)
Albumin/Globulin Ratio: 1.8 (ref 1.1–2.5)
Chloride: 102 mmol/L (ref 97–108)
Creatinine, Ser: 0.77 mg/dL (ref 0.57–1.00)
GFR calc Af Amer: 101 mL/min/{1.73_m2} (ref >59)
GFR calc non Af Amer: 88 mL/min/{1.73_m2} (ref >59)
Globulin, Total: 2.5 g/dL (ref 1.5–4.5)
Potassium: 4.5 mmol/L (ref 3.5–5.2)
Sodium: 140 mmol/L (ref 134–144)
Total Bilirubin: 0.3 mg/dL (ref 0.0–1.2)

## 2013-07-17 LAB — THYROID PANEL WITH TSH
T4, Total: 9 ug/dL (ref 4.5–12.0)
TSH: 3.02 u[IU]/mL (ref 0.450–4.500)

## 2013-07-19 LAB — URINE CULTURE

## 2013-07-20 LAB — PAP IG W/ RFLX HPV ASCU

## 2013-07-21 ENCOUNTER — Other Ambulatory Visit: Payer: Self-pay | Admitting: Nurse Practitioner

## 2013-07-21 MED ORDER — SULFAMETHOXAZOLE-TMP DS 800-160 MG PO TABS: 1.0000 | ORAL_TABLET | Freq: Two times a day (BID) | ORAL | Status: DC

## 2013-08-04 ENCOUNTER — Encounter: Payer: PRIVATE HEALTH INSURANCE | Admitting: Internal Medicine

## 2013-08-19 ENCOUNTER — Encounter: Payer: Self-pay | Admitting: Internal Medicine

## 2013-09-21 ENCOUNTER — Other Ambulatory Visit: Payer: Self-pay

## 2013-09-21 NOTE — Telephone Encounter (Signed)
Detailed message left for patient.

## 2013-09-21 NOTE — Telephone Encounter (Signed)
Last seen 07/15/13  MMM

## 2013-09-21 NOTE — Telephone Encounter (Signed)
Do not do refills on ntibiotics

## 2013-09-25 ENCOUNTER — Other Ambulatory Visit: Payer: Self-pay | Admitting: Internal Medicine

## 2013-10-12 ENCOUNTER — Ambulatory Visit (INDEPENDENT_AMBULATORY_CARE_PROVIDER_SITE_OTHER)
Admission: RE | Admit: 2013-10-12 | Discharge: 2013-10-12 | Disposition: A | Discharge status: Home / Self Care | Payer: PRIVATE HEALTH INSURANCE | Source: Ambulatory Visit | Attending: Internal Medicine | Admitting: Internal Medicine

## 2013-10-12 ENCOUNTER — Encounter: Payer: Self-pay | Admitting: Internal Medicine

## 2013-10-12 ENCOUNTER — Ambulatory Visit (INDEPENDENT_AMBULATORY_CARE_PROVIDER_SITE_OTHER): Payer: PRIVATE HEALTH INSURANCE | Admitting: Internal Medicine

## 2013-10-12 VITALS — BP 130/80 | HR 77 | Temp 97.3°F | Ht 65.5 in | Wt 145.0 lb

## 2013-10-12 DIAGNOSIS — F17200 Nicotine dependence, unspecified, uncomplicated: Secondary | ICD-10-CM

## 2013-10-12 DIAGNOSIS — Z86010 Personal history of colonic polyps: Secondary | ICD-10-CM

## 2013-10-12 DIAGNOSIS — E785 Hyperlipidemia, unspecified: Secondary | ICD-10-CM

## 2013-10-12 DIAGNOSIS — Z0000 Encounter for general adult medical examination without abnormal findings: Secondary | ICD-10-CM

## 2013-10-12 NOTE — Patient Instructions (Signed)
Thanks for coming to see me.  You seem to be doing well. The MOST IMPORTANT THING YOU CAN DO YOUR YOUR HEALTH IS TO STOP SMOKING. This is a real challenge.  Plan Smoker's diary  Develop a plan to change behavior in regard to the #1 cigarettes.  Enlist the help of others   Can prescribe medications for anxiety and craving if needed.  Thanks for allowing me to be your physician.    Smoking Cessation, Tips for Success If you are ready to quit smoking, congratulations! You have chosen to help yourself be healthier. Cigarettes bring nicotine, tar, carbon monoxide, and other irritants into your body. Your lungs, heart, and blood vessels will be able to work better without these poisons. There are many different ways to quit smoking. Nicotine gum, nicotine patches, a nicotine inhaler, or nicotine nasal spray can help with physical craving. Hypnosis, support groups, and medicines help break the habit of smoking. WHAT THINGS CAN I DO TO MAKE QUITTING EASIER?  Here are some tips to help you quit for good:  Pick a date when you will quit smoking completely. Tell all of your friends and family about your plan to quit on that date.  Do not try to slowly cut down on the number of cigarettes you are smoking. Pick a quit date and quit smoking completely starting on that day.  Throw away all cigarettes.   Clean and remove all ashtrays from your home, work, and car.   On a card, write down your reasons for quitting. Carry the card with you and read it when you get the urge to smoke.   Cleanse your body of nicotine. Drink enough water and fluids to keep your urine clear or pale yellow. Do this after quitting to flush the nicotine from your body.   Learn to predict your moods. Do not let a bad situation be your excuse to have a cigarette. Some situations in your life might tempt you into wanting a cigarette.   Never have "just one" cigarette. It leads to wanting another and another. Remind yourself  of your decision to quit.   Change habits associated with smoking. If you smoked while driving or when feeling stressed, try other activities to replace smoking. Stand up when drinking your coffee. Brush your teeth after eating. Sit in a different chair when you read the paper. Avoid alcohol while trying to quit, and try to drink fewer caffeinated beverages. Alcohol and caffeine may urge you to smoke.   Avoid foods and drinks that can trigger a desire to smoke, such as sugary or spicy foods and alcohol.   Ask people who smoke not to smoke around you.   Have something planned to do right after eating or having a cup of coffee. For example, plan to take a walk or exercise.   Try a relaxation exercise to calm you down and decrease your stress. Remember, you may be tense and nervous for the first 2 weeks after you quit, but this will pass.   Find new activities to keep your hands busy. Play with a pen, coin, or rubber band. Doodle or draw things on paper.   Brush your teeth right after eating. This will help cut down on the craving for the taste of tobacco after meals. You can also try mouthwash.   Use oral substitutes in place of cigarettes. Try using lemon drops, carrots, cinnamon sticks, or chewing gum. Keep them handy so they are available when you have the urge  to smoke.   When you have the urge to smoke, try deep breathing.   Designate your home as a nonsmoking area.   If you are a heavy smoker, ask your health care provider about a prescription for nicotine chewing gum. It can ease your withdrawal from nicotine.   Reward yourself. Set aside the cigarette money you save and buy yourself something nice.   Look for support from others. Join a support group or smoking cessation program. Ask someone at home or at work to help you with your plan to quit smoking.   Always ask yourself, "Do I need this cigarette or is this just a reflex?" Tell yourself, "Today, I choose not to  smoke," or "I do not want to smoke." You are reminding yourself of your decision to quit.  Do not replace cigarette smoking with electronic cigarettes (commonly called e-cigarettes). The safety of e-cigarettes is unknown, and some may contain harmful chemicals.  If you relapse, do not give up! Plan ahead and think about what you will do the next time you get the urge to smoke.  HOW WILL I FEEL WHEN I QUIT SMOKING? You may have symptoms of withdrawal because your body is used to nicotine (the addictive substance in cigarettes). You may crave cigarettes, be irritable, feel very hungry, cough often, get headaches, or have difficulty concentrating. The withdrawal symptoms are only temporary. They are strongest when you first quit but will go away within 10 14 days. When withdrawal symptoms occur, stay in control. Think about your reasons for quitting. Remind yourself that these are signs that your body is healing and getting used to being without cigarettes. Remember that withdrawal symptoms are easier to treat than the major diseases that smoking can cause.  Even after the withdrawal is over, expect periodic urges to smoke. However, these cravings are generally short lived and will go away whether you smoke or not. Do not smoke!  WHAT RESOURCES ARE AVAILABLE TO HELP ME QUIT SMOKING? Your health care provider can direct you to community resources or hospitals for support, which may include:  Group support.  Education.  Hypnosis.  Therapy. Document Released: 06/01/2004 Document Revised: 06/24/2013 Document Reviewed: 02/19/2013 [facility] Patient Information 2014 [place], [place].

## 2013-10-12 NOTE — Progress Notes (Signed)
Pre visit review using our clinic review tool, if applicable. No additional management support is needed unless otherwise documented below in the visit note. 

## 2013-10-12 NOTE — Progress Notes (Signed)
 Subjective:    Patient ID: Sheila Harrington, female    DOB: 10/05/1960, 52 y.o.   MRN: [id number]  HPI Sheila Harrington presents for annual medical exam. Interval history is unremarkable: no major illness, no surgery, no major injuries. Had wellness exam in [place] - normal PAP, breast exam and mammogram. Is current with dentist, has an appointment with eye doctor. Had colonoscopy Nov 13, 2013. She did have complete lab at her October gyn visit: normal Bmet, nl LFTs, Cholesterol 156, NMR lipoprofile ok except for elevated small particle size LDL. However, she is on statin therapy with last LD of 96.   She is at the contemplative stage for smoking cessation.        Review of Systems Constitutional:  Negative for fever, chills, activity change and unexpected weight change.  HEENT:  Negative for hearing loss, ear pain, congestion, neck stiffness and postnasal drip. Negative for sore throat or swallowing problems. Negative for dental complaints.   Eyes: Negative for vision loss or change in visual acuity.  Respiratory: Negative for chest tightness and wheezing. Negative for DOE.   Cardiovascular: Negative for chest pain or palpitations. No decreased exercise tolerance Gastrointestinal: No change in bowel habit. No bloating or gas. No reflux or indigestion Genitourinary: Negative for urgency, frequency, flank pain and difficulty urinating.  Musculoskeletal: Negative for myalgias, back pain, arthralgias and gait problem.  Neurological: Negative for dizziness, tremors, weakness and headaches.  Hematological: Negative for adenopathy.  Psychiatric/Behavioral: Negative for behavioral problems and dysphoric mood.     Objective:   Physical Exam Filed Vitals:   10/12/13 0920  BP: 130/80  Pulse: 77  Temp: 97.3 F (36.3 C)   Filed Vitals:   10/12/13 0920  BP: 130/80  Pulse: 77  Temp: 97.3 F (36.3 C)   Wt Readings from Last 3 Encounters:  10/12/13 145 lb (65.772 kg)  07/15/13 146 lb  (66.225 kg)  11/13/12 145 lb (65.772 kg)   Gen'l: well nourished, well developed Woman in no distress HEENT - [employer]/AT, EACs/TMs normal, oropharynx with native dentition in good condition, no buccal or palatal lesions, posterior pharynx clear, mucous membranes moist. C&S clear, PERRLA, fundi - normal Neck - supple, no thyromegaly Nodes- negative submental, cervical, supraclavicular regions Chest - no deformity, no CVAT Lungs - clear without rales, wheezes. No increased work of breathing Breast - deferred to gyn and mammography Cardiovascular - regular rate and rhythm, quiet precordium, no murmurs, rubs or gallops, 2+ radial, DP and PT pulses Abdomen - BS+ x 4, no HSM, no guarding or rebound or tenderness Pelvic - deferred to gyn Rectal - deferred to gyn Extremities - no clubbing, cyanosis, edema or deformity.  Neuro - A&O x 3, CN II-XII normal, motor strength normal and equal, DTRs 2+ and symmetrical biceps, radial, and patellar tendons. Cerebellar - no tremor, no rigidity, fluid movement and normal gait. Derm - Head, neck, back, abdomen and extremities without suspicious lesions  Recent Results (from the past 2160 hour(s))  POCT URINALYSIS DIPSTICK     Status: None   Collection Time    07/15/13  3:44 PM      Result Value Range   Color, UA yellow     Clarity, UA clear     Glucose, UA neg     Bilirubin, UA neg     Ketones, UA neg     Spec Grav, UA 1.020     Blood, UA trace     pH, UA 6.0       Protein, UA neg     Urobilinogen, UA negative     Nitrite, UA +     Leukocytes, UA Trace    POCT UA - MICROSCOPIC ONLY     Status: None   Collection Time    07/15/13  3:46 PM      Result Value Range   WBC, Ur, HPF, POC 4-7     RBC, urine, microscopic 5-10     Bacteria, U Microscopic many     Mucus, UA mod     Epithelial cells, urine per micros few     Crystals, Ur, HPF, POC neg     Casts, Ur, LPF, POC neg     Yeast, UA neg    CMP14+EGFR     Status: None   Collection Time    07/15/13   4:16 PM      Result Value Range   Glucose 89  65 - 99 mg/dL   BUN 14  6 - 24 mg/dL   Creatinine, Ser 0.77  0.57 - 1.00 mg/dL   GFR calc non Af Amer 88  >59 mL/min/1.73   GFR calc Af Amer 101  >59 mL/min/1.73   BUN/Creatinine Ratio 18  9 - 23   Sodium 140  134 - 144 mmol/L   Potassium 4.5  3.5 - 5.2 mmol/L   Chloride 102  97 - 108 mmol/L   CO2 25  18 - 29 mmol/L   Calcium 9.3  8.7 - 10.2 mg/dL   Total Protein 7.1  6.0 - 8.5 g/dL   Albumin 4.6  3.5 - 5.5 g/dL   Globulin, Total 2.5  1.5 - 4.5 g/dL   Albumin/Globulin Ratio 1.8  1.1 - 2.5   Total Bilirubin 0.3  0.0 - 1.2 mg/dL   Alkaline Phosphatase 60  39 - 117 IU/L   AST 13  0 - 40 IU/L   ALT 13  0 - 32 IU/L  NMR, LIPOPROFILE     Status: Abnormal   Collection Time    07/15/13  4:16 PM      Result Value Range   LDL Particle Number [id number] (*) <1000 nmol/L   Comment:                           Low                   < 1000                               Moderate         1000 - 1299                               Borderline-High  1300 - 1599                               High             1600 - 2000                               Very High             > 2000   LDLC SERPL CALC-MCNC 88  <100 mg/dL   Comment: LDL-C   is inaccurate if patient is nonfasting.                               Optimal               <  100                               Above optimal     100 -  129                               Borderline        130 -  159                               High              160 -  189                               Very high             >  189   HDL Cholesterol by NMR 41  >=40 mg/dL   Triglycerides by NMR 136  <150 mg/dL   Cholesterol 156  <200 mg/dL   HDL Particle Number 32.0  >=30.5 umol/L   Small LDL Particle Number 1167 (*) <=527 nmol/L   LDL Size 20.1 (*) >20.5 nm   Comment:  ----------------------------------------------------------                      INTERPRETATIVE INFORMATION                      PARTICLE CONCENTRATION AND SIZE                          <--Lower CVD Risk   Higher CVD Risk-->       LDL AND HDL PARTICLES   Percentile in Reference Population       HDL-P (total)        High     75th    50th    25th   Low                            >34.9    34.9    30.5    26.7   <26.7       Small LDL-P          Low      25th    50th    75th   High                            <117     117     527     839    >839       LDL Size   <-Large (Pattern A)->    <-Small (Pattern B)->                         23.0    20.6  20.5      19.0      ----------------------------------------------------------   LP-IR Score 63 (*) <=45   Comment: LP-IR Score is inaccurate if patient is non-fasting.     The LP-IR Score combines information from lipoprotein     particle concentration and size to give improved     assessment of insulin resistance and diabetes risk.     Small LDL-P, LDL Particle Size, HDL-Particle, and     LP-IR Score have been validated by [employer]     but not cleared by US FDA; the clinical utility     of these test results has not been fully established.     INSULIN RESISTANCE MARKER         <--Insulin Sensitive    Insulin Resistant-->                Percentile in Reference Population     Insulin Resistance Score     LP-IR Score   Low   25th   50th   75th   High                   <27   27     45     63     >63  THYROID PANEL WITH TSH     Status: None   Collection Time    07/15/13  4:16 PM      Result Value Range   TSH 3.020  0.450 - 4.500 uIU/mL   T4, Total 9.0  4.5 - 12.0 ug/dL   T3 Uptake Ratio 28  24 - 39 %   Free Thyroxine Index 2.5  1.2 - 4.9  PAP IG W/ RFLX HPV ASCU     Status: None   Collection Time    07/15/13  4:26 PM      Result Value Range   DIAGNOSIS: Comment     Comment: NEGATIVE FOR INTRAEPITHELIAL LESION AND MALIGNANCY.   Specimen adequacy: Comment     Comment: Satisfactory for evaluation. Endocervical and/or squamous metaplastic     cells (endocervical component) are present.   PATH  REPORT.FINAL DX SPEC Comment     Comment: V72.31 ; Routine gynecological examination   Performed by: Comment     Comment: Rick Gadd, Supervisory Cytotechnologist (ASCP)   PAP SMEAR COMMENT .     Note: Comment     Comment: The Pap smear is a screening test designed to aid in the detection of     premalignant and malignant conditions of the uterine cervix.  It is not a     diagnostic procedure and should not be used as the sole means of detecting     cervical cancer.  Both false-positive and false-negative reports do occur.   Test Methodology Comment     Comment: This liquid based ThinPrep(R) pap test was screened with the     use of an image guided system.   PAP REFLEX: Comment     Comment: The HPV DNA reflex criteria were not met with this specimen result     therefore, no HPV testing was performed.  POCT CBC     Status: None   Collection Time    07/15/13  4:42 PM      Result Value Range   WBC 7.9  4.6 - 10.2 K/uL   Lymph, poc 3.3  0.6 - 3.4   POC LYMPH PERCENT 41.8  10 - 50 %L   POC Granulocyte 4.5  2 -   6.9   Granulocyte percent 56.9  37 - 80 %G   RBC 4.7  4.04 - 5.48 M/uL   Hemoglobin 13.7  12.2 - 16.2 g/dL   HCT, POC 41.9  37.7 - 47.9 %   MCV 88.7  80 - 97 fL   MCH, POC 29.0  27 - 31.2 pg   MCHC 32.7  31.8 - 35.4 g/dL   RDW, POC 12.5     Platelet Count, POC 214.0  142 - 424 K/uL   MPV 8.0  0 - 99.8 fL  URINE CULTURE     Status: Abnormal   Collection Time    07/15/13  5:24 PM      Result Value Range   Urine Culture, Routine Final report (*)    Result 1 Comment (*)    Comment: Escherichia coli, identified by an automated biochemical system.     Greater than 100,000 colony forming units per mL   ANTIMICROBIAL SUSCEPTIBILITY Comment     Comment:    S = Susceptible; I = Intermediate; R = Resistant                         P = Positive; N = Negative                 MICS are expressed in micrograms per mL        Antibiotic                 RSLT#1    RSLT#2    RSLT#3    RSLT#4       Amoxicillin/Clavulanic Acid    S     Ampicillin                     R     Cefepime                       S     Ceftriaxone                    S     Cefuroxime                     S     Cephalothin                    S     Ciprofloxacin                  R     Ertapenem                      S     Gentamicin                     S     Imipenem                       S     Levofloxacin                   R     Nitrofurantoin                 S     Piperacillin                   R     Tetracycline  S     Tobramycin                     S     Trimethoprim/Sulfa             S          Assessment & Plan:   

## 2013-10-13 NOTE — Assessment & Plan Note (Signed)
Interval history is unremarkable. Limited physical exam is normal. Reviewed previous labs from January '14, and October '14 - in normal range and no indication to repeat lab. She is current with colorectal and breast cancer screening. Immunizations are current.  In summary A very nice woman who appears medically stable. She is given encouragement on her plans to quit smoking. She will return for continued smoking cessation counseling and treatment.

## 2013-10-13 NOTE — Assessment & Plan Note (Signed)
Taking and tolerating "statin" therapy. Last lipid panel Jan '14 revealed good control with LDL < 100. Oct '14 patient had non-fasting NMRlipo profile with  Elevated small LDL particle size - this data is of uncertain significance given she is on "statain" therapy with below goal TC, LDL and target HDL.  Plan Continue present regimen.

## 2013-10-13 NOTE — Assessment & Plan Note (Signed)
No GI complaints. Due for follow up colonoscopy 2019

## 2013-10-13 NOTE — Assessment & Plan Note (Signed)
Patient is at contemplative stage - discussed nature of nicotine addiction, reviewed all parts of smoking cessation strategy and provide "tips for success" handout.  Plan CXR - does not meet all criteria for screening CT  Patient to return in near future after she has completed smoker's diary, behavioral change strategy, self - assessed risk of anxiety or need for Chantix.  Gave "tons" of encouragement to quit.  Addenedum: CXR negative

## 2014-03-31 ENCOUNTER — Other Ambulatory Visit: Payer: Self-pay

## 2014-03-31 MED ORDER — SIMVASTATIN 20 MG PO TABS: 20.0000 mg | ORAL_TABLET | Freq: Every day | ORAL | Status: DC

## 2014-06-08 ENCOUNTER — Other Ambulatory Visit: Payer: Self-pay | Admitting: Internal Medicine

## 2014-06-14 ENCOUNTER — Other Ambulatory Visit: Payer: Self-pay | Admitting: Geriatric Medicine

## 2014-06-14 ENCOUNTER — Telehealth: Payer: Self-pay | Admitting: Internal Medicine

## 2014-06-14 MED ORDER — SIMVASTATIN 20 MG PO TABS: ORAL_TABLET | ORAL | Status: DC

## 2014-06-14 NOTE — Telephone Encounter (Signed)
Patient is establishing care 11/3.  She is requesting refill on cholesterol med to be sent to [place] until she can establish care

## 2014-06-14 NOTE — Telephone Encounter (Signed)
Sent to pharmacy 

## 2014-07-09 ENCOUNTER — Other Ambulatory Visit: Payer: Self-pay | Admitting: Internal Medicine

## 2014-07-20 ENCOUNTER — Ambulatory Visit: Payer: PRIVATE HEALTH INSURANCE | Admitting: Internal Medicine

## 2014-08-10 ENCOUNTER — Other Ambulatory Visit (INDEPENDENT_AMBULATORY_CARE_PROVIDER_SITE_OTHER): Payer: BC Managed Care – PPO

## 2014-08-10 ENCOUNTER — Ambulatory Visit (INDEPENDENT_AMBULATORY_CARE_PROVIDER_SITE_OTHER): Payer: BC Managed Care – PPO | Admitting: Internal Medicine

## 2014-08-10 ENCOUNTER — Encounter: Payer: Self-pay | Admitting: Internal Medicine

## 2014-08-10 VITALS — BP 108/74 | HR 84 | Temp 98.1°F | Resp 12 | Ht 65.0 in | Wt 143.1 lb

## 2014-08-10 DIAGNOSIS — Z0000 Encounter for general adult medical examination without abnormal findings: Secondary | ICD-10-CM

## 2014-08-10 DIAGNOSIS — Z720 Tobacco use: Secondary | ICD-10-CM

## 2014-08-10 DIAGNOSIS — N952 Postmenopausal atrophic vaginitis: Secondary | ICD-10-CM | POA: Insufficient documentation

## 2014-08-10 DIAGNOSIS — F17200 Nicotine dependence, unspecified, uncomplicated: Secondary | ICD-10-CM

## 2014-08-10 DIAGNOSIS — E785 Hyperlipidemia, unspecified: Secondary | ICD-10-CM

## 2014-08-10 LAB — BASIC METABOLIC PANEL
BUN: 12 mg/dL (ref 6–23)
CO2: 24 mEq/L (ref 19–32)
Calcium: 8.9 mg/dL (ref 8.4–10.5)
Chloride: 103 mEq/L (ref 96–112)
Creatinine, Ser: 0.7 mg/dL (ref 0.4–1.2)
GFR: 87.94 mL/min (ref >60.00)
GLUCOSE: 82 mg/dL (ref 70–99)
POTASSIUM: 4.2 meq/L (ref 3.5–5.1)
SODIUM: 139 meq/L (ref 135–145)

## 2014-08-10 LAB — LIPID PANEL
CHOLESTEROL: 174 mg/dL (ref 0–200)
HDL: 39.6 mg/dL (ref >39.00)
LDL Cholesterol: 113 mg/dL — ABNORMAL HIGH (ref 0–99)
NonHDL: 134.4
Total CHOL/HDL Ratio: 4
Triglycerides: 105 mg/dL (ref 0.0–149.0)
VLDL: 21 mg/dL (ref 0.0–40.0)

## 2014-08-10 MED ORDER — ESTRADIOL 10 MCG VA TABS: 10.0000 ug | ORAL_TABLET | Freq: Every day | VAGINAL | Status: DC

## 2014-08-10 NOTE — Progress Notes (Signed)
   Subjective:    Patient ID: Sheila Harrington, female    DOB: Sep 10, 1961, 53 y.o.   MRN: [id number]  HPI The patient is a 53 year old female who comes in today to establish care. She has possible history of tobacco abuse, colon polyps, hyperlipidemia. She is doing well overall, the only problem she is having is vaginal dryness and pain with intercourse since going into menopause. She denies hot flashes regularly. She denies any mood dysfunction. Denies chest pains, shortness of breath, abdominal pain, constipation, diarrhea. She denies any leg pain or swelling.  Review of Systems  Constitutional: Negative for fever, diaphoresis, activity change, appetite change and fatigue.  HENT: Negative.   Eyes: Negative.   Respiratory: Negative for cough, chest tightness, shortness of breath and wheezing.   Cardiovascular: Negative for chest pain, palpitations and leg swelling.  Gastrointestinal: Negative for nausea, abdominal pain, diarrhea, constipation and abdominal distention.  Genitourinary: Positive for dyspareunia.  Musculoskeletal: Negative.   Skin: Negative.   Neurological: Negative.   Psychiatric/Behavioral: Negative.       Objective:   Physical Exam  Constitutional: She is oriented to person, place, and time. She appears well-developed and well-nourished.  HENT:  Head: Normocephalic and atraumatic.  Eyes: EOM are normal.  Neck: Normal range of motion.  Cardiovascular: Normal rate, regular rhythm and intact distal pulses.   Pulmonary/Chest: Effort normal and breath sounds normal. No respiratory distress. She has no wheezes. She has no rales.  Abdominal: Soft. Bowel sounds are normal. She exhibits no distension. There is no tenderness. There is no rebound.  Musculoskeletal: She exhibits no edema.  Neurological: She is alert and oriented to person, place, and time. Coordination normal.  Skin: Skin is warm and dry.   Filed Vitals:   08/10/14 1357  BP: 108/74  Pulse: 84  Temp: 98.1  F (36.7 C)  TempSrc: Oral  Resp: 12  Height: 5\' 5"  (1.651 m)  Weight: 143 lb 1.9 oz (64.919 kg)  SpO2: 96%      Assessment & Plan:

## 2014-08-10 NOTE — Progress Notes (Signed)
Pre visit review using our clinic review tool, if applicable. No additional management support is needed unless otherwise documented below in the visit note. 

## 2014-08-10 NOTE — Assessment & Plan Note (Signed)
Patient is up-to-date on health screening and has already had her flu shot for this year.

## 2014-08-10 NOTE — Assessment & Plan Note (Signed)
Advised patient strongly to quit. We talked about the cigarettes as a good form of smoking cessation. Reminded her about the risks and harms from cigarette smoke.

## 2014-08-10 NOTE — Assessment & Plan Note (Signed)
We'll try estradiol vaginal pills. One pill daily for 2 weeks then 1 pill twice weekly.

## 2014-08-10 NOTE — Patient Instructions (Signed)
We will have you use a vaginal pill. Insert 1 daily for 2 weeks then after that insert one pill twice weekly.  We will check your blood work today, we will see back next year for your physical.  If you have any new problems or questions before your next visit please feel free to call us back.  We think that you should try the use the e-cigarette in order to stop smoking altogether.  Health Maintenance Adopting a healthy lifestyle and getting preventive care can go a long way to promote health and wellness. Talk with your health care provider about what schedule of regular examinations is right for you. This is a good chance for you to check in with your provider about disease prevention and staying healthy. In between checkups, there are plenty of things you can do on your own. Experts have done a lot of research about which lifestyle changes and preventive measures are most likely to keep you healthy. Ask your health care provider for more information. WEIGHT AND DIET  Eat a healthy diet  Be sure to include plenty of vegetables, fruits, low-fat dairy products, and lean protein.  Do not eat a lot of foods high in solid fats, added sugars, or salt.  Get regular exercise. This is one of the most important things you can do for your health.  Most adults should exercise for at least 150 minutes each week. The exercise should increase your heart rate and make you sweat (moderate-intensity exercise).  Most adults should also do strengthening exercises at least twice a week. This is in addition to the moderate-intensity exercise.  Maintain a healthy weight  Body mass index (BMI) is a measurement that can be used to identify possible weight problems. It estimates body fat based on height and weight. Your health care provider can help determine your BMI and help you achieve or maintain a healthy weight.  For females 104 years of age and older:   A BMI below 18.5 is considered underweight.  A  BMI of 18.5 to 24.9 is normal.  A BMI of 25 to 29.9 is considered overweight.  A BMI of 30 and above is considered obese.  Watch levels of cholesterol and blood lipids  You should start having your blood tested for lipids and cholesterol at 55 years of age, then have this test every 5 years.  You may need to have your cholesterol levels checked more often if:  Your lipid or cholesterol levels are high.  You are older than 55 years of age.  You are at high risk for heart disease.  CANCER SCREENING   Lung Cancer  Lung cancer screening is recommended for adults 39-35 years old who are at high risk for lung cancer because of a history of smoking.  A yearly low-dose CT scan of the lungs is recommended for people who:  Currently smoke.  Have quit within the past 15 years.  Have at least a 30-pack-year history of smoking. A pack year is smoking an average of one pack of cigarettes a day for 1 year.  Yearly screening should continue until it has been 15 years since you quit.  Yearly screening should stop if you develop a health problem that would prevent you from having lung cancer treatment.  Breast Cancer  Practice breast self-awareness. This means understanding how your breasts normally appear and feel.  It also means doing regular breast self-exams. Let your health care provider know about any changes, no matter how  small.  If you are in your 20s or 30s, you should have a clinical breast exam (CBE) by a health care provider every 1-3 years as part of a regular health exam.  If you are 40 or older, have a CBE every year. Also consider having a breast X-ray (mammogram) every year.  If you have a family history of breast cancer, talk to your health care provider about genetic screening.  If you are at high risk for breast cancer, talk to your health care provider about having an MRI and a mammogram every year.  Breast cancer gene (BRCA) assessment is recommended for women  who have family members with BRCA-related cancers. BRCA-related cancers include:  Breast.  Ovarian.  Tubal.  Peritoneal cancers.  Results of the assessment will determine the need for genetic counseling and BRCA1 and BRCA2 testing. Cervical Cancer Routine pelvic examinations to screen for cervical cancer are no longer recommended for nonpregnant women who are considered low risk for cancer of the pelvic organs (ovaries, uterus, and vagina) and who do not have symptoms. A pelvic examination may be necessary if you have symptoms including those associated with pelvic infections. Ask your health care provider if a screening pelvic exam is right for you.   The Pap test is the screening test for cervical cancer for women who are considered at risk.  If you had a hysterectomy for a problem that was not cancer or a condition that could lead to cancer, then you no longer need Pap tests.  If you are older than 65 years, and you have had normal Pap tests for the past 10 years, you no longer need to have Pap tests.  If you have had past treatment for cervical cancer or a condition that could lead to cancer, you need Pap tests and screening for cancer for at least 20 years after your treatment.  If you no longer get a Pap test, assess your risk factors if they change (such as having a new sexual partner). This can affect whether you should start being screened again.  Some women have medical problems that increase their chance of getting cervical cancer. If this is the case for you, your health care provider may recommend more frequent screening and Pap tests.  The human papillomavirus (HPV) test is another test that may be used for cervical cancer screening. The HPV test looks for the virus that can cause cell changes in the cervix. The cells collected during the Pap test can be tested for HPV.  The HPV test can be used to screen women 23 years of age and older. Getting tested for HPV can extend the  interval between normal Pap tests from three to five years.  An HPV test also should be used to screen women of any age who have unclear Pap test results.  After 55 years of age, women should have HPV testing as often as Pap tests.  Colorectal Cancer  This type of cancer can be detected and often prevented.  Routine colorectal cancer screening usually begins at 55 years of age and continues through 55 years of age.  Your health care provider may recommend screening at an earlier age if you have risk factors for colon cancer.  Your health care provider may also recommend using home test kits to check for hidden blood in the stool.  A small camera at the end of a tube can be used to examine your colon directly (sigmoidoscopy or colonoscopy). This is done to  check for the earliest forms of colorectal cancer.  Routine screening usually begins at age 3.  Direct examination of the colon should be repeated every 5-10 years through 55 years of age. However, you may need to be screened more often if early forms of precancerous polyps or small growths are found. Skin Cancer  Check your skin from head to toe regularly.  Tell your health care provider about any new moles or changes in moles, especially if there is a change in a mole's shape or color.  Also tell your health care provider if you have a mole that is larger than the size of a pencil eraser.  Always use sunscreen. Apply sunscreen liberally and repeatedly throughout the day.  Protect yourself by wearing long sleeves, pants, a wide-brimmed hat, and sunglasses whenever you are outside. HEART DISEASE, DIABETES, AND HIGH BLOOD PRESSURE   Have your blood pressure checked at least every 1-2 years. High blood pressure causes heart disease and increases the risk of stroke.  If you are between 36 years and 37 years old, ask your health care provider if you should take aspirin to prevent strokes.  Have regular diabetes screenings. This  involves taking a blood sample to check your fasting blood sugar level.  If you are at a normal weight and have a low risk for diabetes, have this test once every three years after 55 years of age.  If you are overweight and have a high risk for diabetes, consider being tested at a younger age or more often. PREVENTING INFECTION  Hepatitis B  If you have a higher risk for hepatitis B, you should be screened for this virus. You are considered at high risk for hepatitis B if:  You were born in a country where hepatitis B is common. Ask your health care provider which countries are considered high risk.  Your parents were born in a high-risk country, and you have not been immunized against hepatitis B (hepatitis B vaccine).  You have HIV or AIDS.  You use needles to inject street drugs.  You live with someone who has hepatitis B.  You have had sex with someone who has hepatitis B.  You get hemodialysis treatment.  You take certain medicines for conditions, including cancer, organ transplantation, and autoimmune conditions. Hepatitis C  Blood testing is recommended for:  Everyone born from [birth year] through 1967.  Anyone with known risk factors for hepatitis C. Sexually transmitted infections (STIs)  You should be screened for sexually transmitted infections (STIs) including gonorrhea and chlamydia if:  You are sexually active and are younger than 55 years of age.  You are older than 55 years of age and your health care provider tells you that you are at risk for this type of infection.  Your sexual activity has changed since you were last screened and you are at an increased risk for chlamydia or gonorrhea. Ask your health care provider if you are at risk.  If you do not have HIV, but are at risk, it may be recommended that you take a prescription medicine daily to prevent HIV infection. This is called pre-exposure prophylaxis (PrEP). You are considered at risk if:  You are  sexually active and do not regularly use condoms or know the HIV status of your partner(s).  You take drugs by injection.  You are sexually active with a partner who has HIV. Talk with your health care provider about whether you are at high risk of being infected with HIV.  If you choose to begin PrEP, you should first be tested for HIV. You should then be tested every 3 months for as long as you are taking PrEP.  PREGNANCY   If you are premenopausal and you may become pregnant, ask your health care provider about preconception counseling.  If you may become pregnant, take 400 to 800 micrograms (mcg) of folic acid every day.  If you want to prevent pregnancy, talk to your health care provider about birth control (contraception). OSTEOPOROSIS AND MENOPAUSE   Osteoporosis is a disease in which the bones lose minerals and strength with aging. This can result in serious bone fractures. Your risk for osteoporosis can be identified using a bone density scan.  If you are 90 years of age or older, or if you are at risk for osteoporosis and fractures, ask your health care provider if you should be screened.  Ask your health care provider whether you should take a calcium or vitamin D supplement to lower your risk for osteoporosis.  Menopause may have certain physical symptoms and risks.  Hormone replacement therapy may reduce some of these symptoms and risks. Talk to your health care provider about whether hormone replacement therapy is right for you.  HOME CARE INSTRUCTIONS   Schedule regular health, dental, and eye exams.  Stay current with your immunizations.   Do not use any tobacco products including cigarettes, chewing tobacco, or electronic cigarettes.  If you are pregnant, do not drink alcohol.  If you are breastfeeding, limit how much and how often you drink alcohol.  Limit alcohol intake to no more than 1 drink per day for nonpregnant women. One drink equals 12 ounces of beer, 5  ounces of wine, or 1 ounces of hard liquor.  Do not use street drugs.  Do not share needles.  Ask your health care provider for help if you need support or information about quitting drugs.  Tell your health care provider if you often feel depressed.  Tell your health care provider if you have ever been abused or do not feel safe at home. Document Released: 03/19/2011 Document Revised: 01/18/2014 Document Reviewed: 08/05/2013 [facility] Patient Information 2015 [place], [place]. This information is not intended to replace advice given to you by your health care provider. Make sure you discuss any questions you have with your health care provider.

## 2014-08-10 NOTE — Assessment & Plan Note (Signed)
Recheck lipid panel 

## 2014-09-03 ENCOUNTER — Other Ambulatory Visit: Payer: Self-pay | Admitting: Internal Medicine

## 2014-09-16 ENCOUNTER — Other Ambulatory Visit: Payer: Self-pay | Admitting: Geriatric Medicine

## 2014-09-16 ENCOUNTER — Telehealth: Payer: Self-pay | Admitting: Internal Medicine

## 2014-09-16 MED ORDER — SIMVASTATIN 20 MG PO TABS: ORAL_TABLET | ORAL | Status: DC

## 2014-09-16 NOTE — Telephone Encounter (Signed)
Pt requesting Zocor be sent to her pharmacy in [place] as some kind of electronic error.

## 2014-09-20 ENCOUNTER — Encounter: Payer: Self-pay | Admitting: Internal Medicine

## 2014-11-22 ENCOUNTER — Other Ambulatory Visit: Payer: Self-pay

## 2014-11-22 DIAGNOSIS — D229 Melanocytic nevi, unspecified: Secondary | ICD-10-CM

## 2014-11-22 HISTORY — DX: Melanocytic nevi, unspecified: D22.9

## 2015-01-13 ENCOUNTER — Other Ambulatory Visit: Payer: Self-pay | Admitting: Internal Medicine

## 2015-01-21 IMAGING — CR DG CHEST 2V
2 series · 2 of 2 positions shown · non-contrast
Comparison: None

CLINICAL DATA: Physical exam, smoker

EXAM:
CHEST  2 VIEW

[view not recorded (1 of 2)]
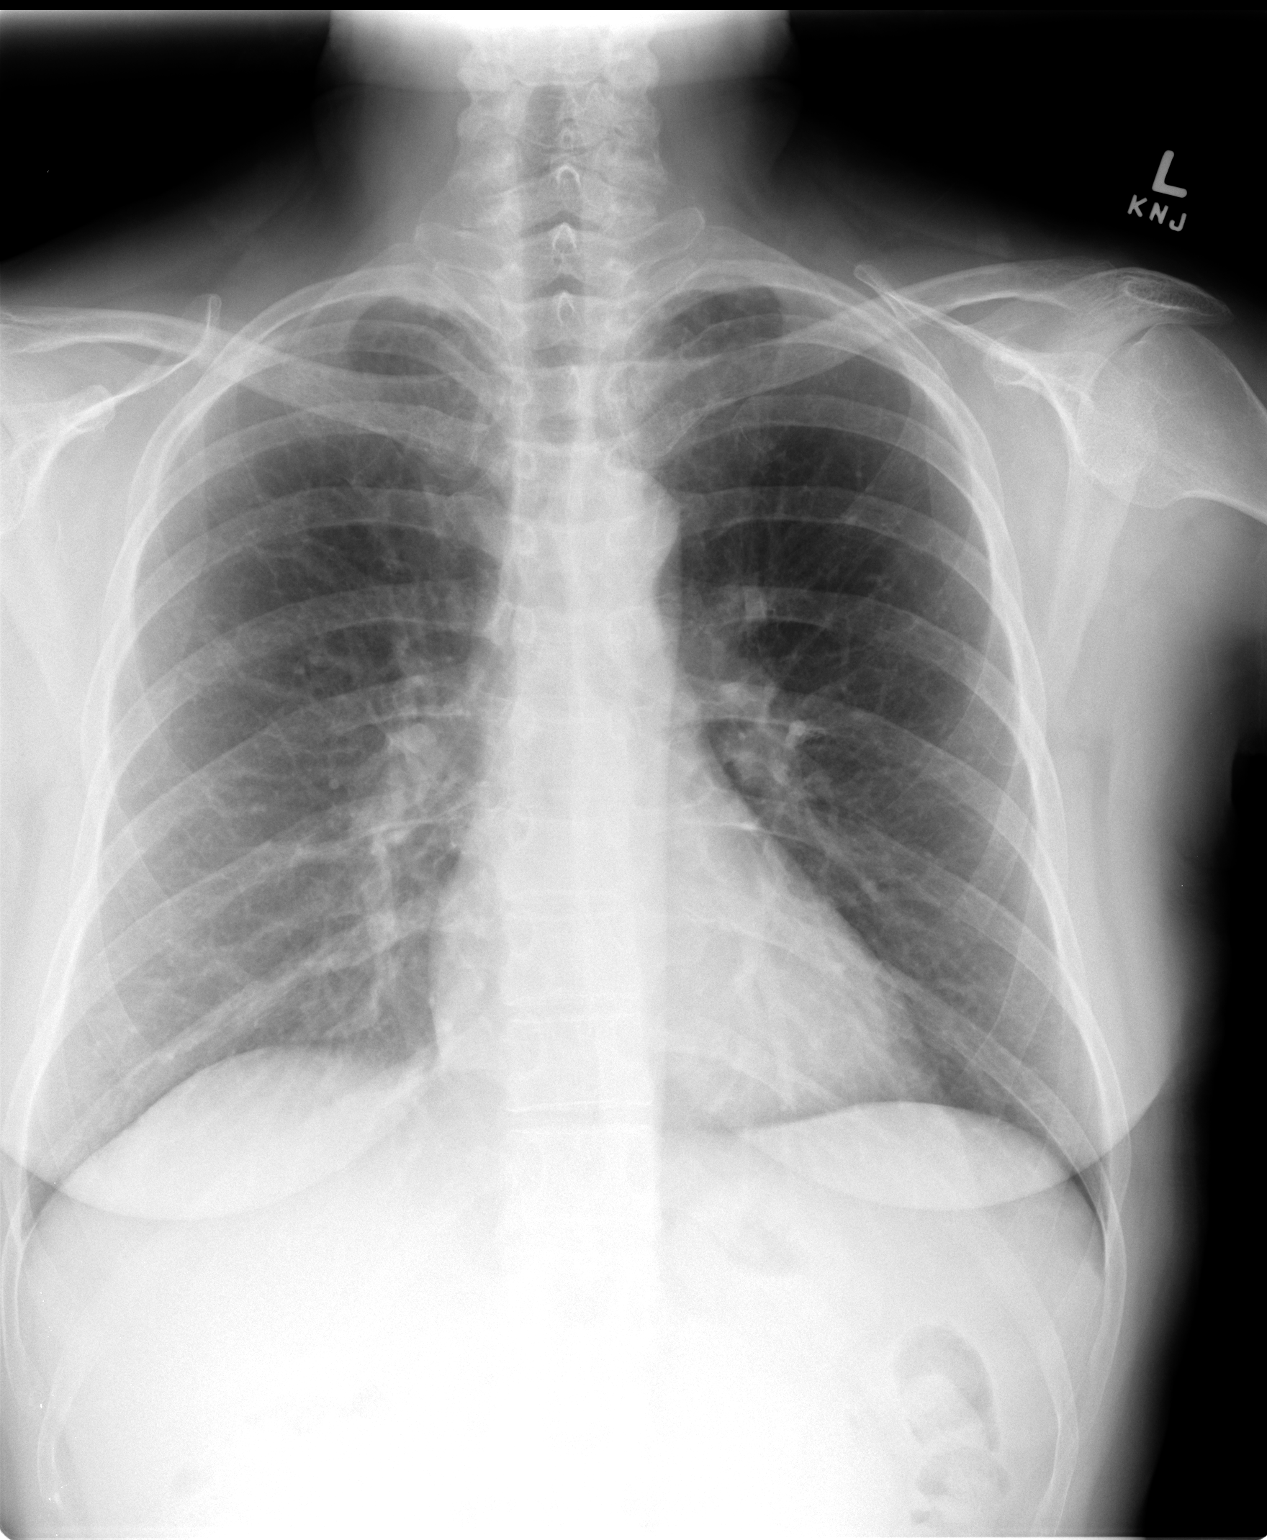

[view not recorded (2 of 2)]
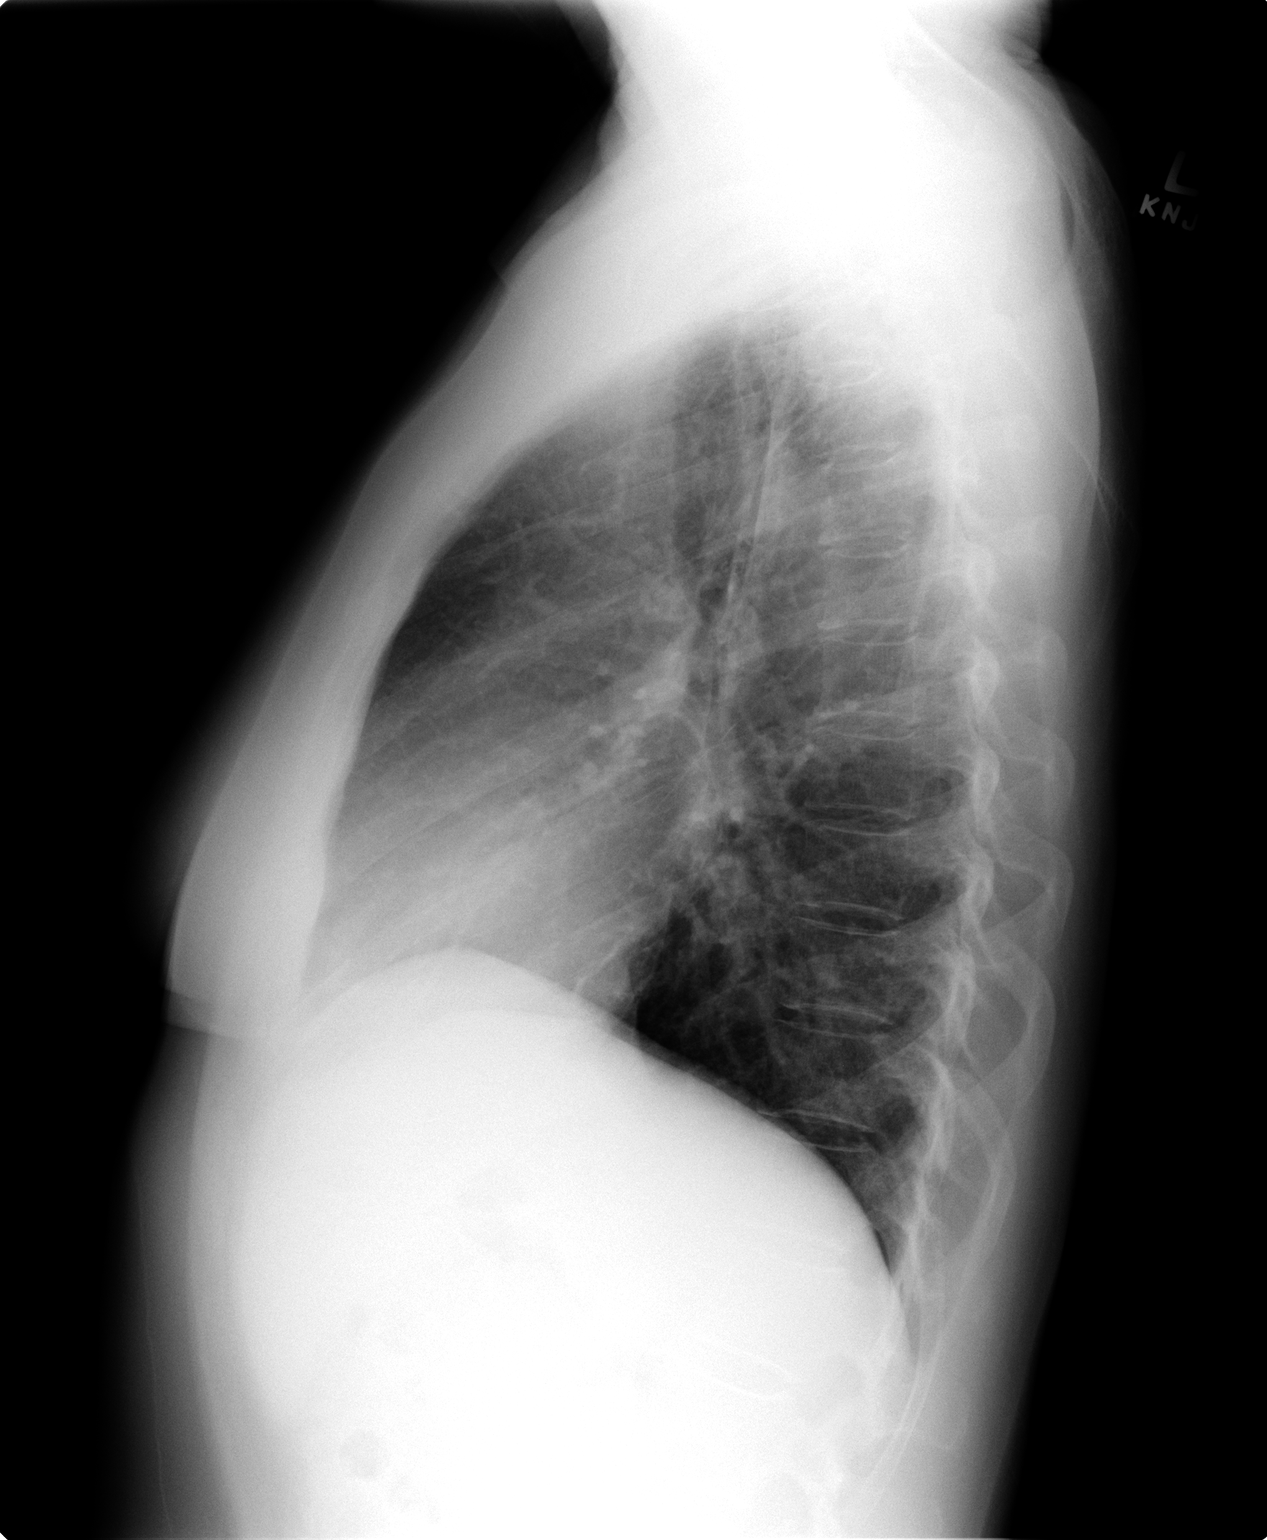

[2 of 2 positions shown; findings below may reference images not displayed]

FINDINGS: Normal heart size, mediastinal contours, and pulmonary vascularity.

Lungs clear.

No pleural effusion or pneumothorax.

Bones unremarkable.
IMPRESSION: No acute abnormalities.

## 2015-05-03 ENCOUNTER — Telehealth: Payer: Self-pay | Admitting: Internal Medicine

## 2015-05-03 NOTE — Telephone Encounter (Signed)
Pt called requesting refill for escitalopram (LEXAPRO) 10 MG tablet [[id number]. I did schedule her annual wellness out in November  Her best number is [phone] Pharmacy is [facility] in [place]

## 2015-05-04 ENCOUNTER — Other Ambulatory Visit: Payer: Self-pay | Admitting: Geriatric Medicine

## 2015-05-04 MED ORDER — ESCITALOPRAM OXALATE 10 MG PO TABS: 10.0000 mg | ORAL_TABLET | Freq: Every day | ORAL | Status: DC

## 2015-06-20 ENCOUNTER — Other Ambulatory Visit: Payer: Self-pay

## 2015-08-10 ENCOUNTER — Other Ambulatory Visit: Payer: Self-pay | Admitting: Internal Medicine

## 2015-08-15 ENCOUNTER — Encounter: Payer: Self-pay | Admitting: Internal Medicine

## 2015-08-19 ENCOUNTER — Encounter: Payer: Self-pay | Admitting: Internal Medicine

## 2015-09-14 ENCOUNTER — Other Ambulatory Visit (INDEPENDENT_AMBULATORY_CARE_PROVIDER_SITE_OTHER): Payer: BC Managed Care – PPO

## 2015-09-14 ENCOUNTER — Ambulatory Visit (INDEPENDENT_AMBULATORY_CARE_PROVIDER_SITE_OTHER): Payer: BC Managed Care – PPO | Admitting: Internal Medicine

## 2015-09-14 ENCOUNTER — Encounter: Payer: Self-pay | Admitting: Internal Medicine

## 2015-09-14 VITALS — BP 132/80 | HR 83 | Temp 98.4°F | Resp 12 | Ht 66.0 in | Wt 147.0 lb

## 2015-09-14 DIAGNOSIS — N952 Postmenopausal atrophic vaginitis: Secondary | ICD-10-CM | POA: Diagnosis not present

## 2015-09-14 DIAGNOSIS — Z0000 Encounter for general adult medical examination without abnormal findings: Secondary | ICD-10-CM

## 2015-09-14 DIAGNOSIS — F17200 Nicotine dependence, unspecified, uncomplicated: Secondary | ICD-10-CM

## 2015-09-14 DIAGNOSIS — Z720 Tobacco use: Secondary | ICD-10-CM

## 2015-09-14 DIAGNOSIS — E785 Hyperlipidemia, unspecified: Secondary | ICD-10-CM | POA: Diagnosis not present

## 2015-09-14 LAB — LIPID PANEL
CHOLESTEROL: 165 mg/dL (ref 0–200)
HDL: 40.5 mg/dL (ref >39.00)
LDL CALC: 101 mg/dL — AB (ref 0–99)
NONHDL: 124.66
Total CHOL/HDL Ratio: 4
Triglycerides: 117 mg/dL (ref 0.0–149.0)
VLDL: 23.4 mg/dL (ref 0.0–40.0)

## 2015-09-14 LAB — URINALYSIS, ROUTINE W REFLEX MICROSCOPIC
Bilirubin Urine: NEGATIVE
HGB URINE DIPSTICK: NEGATIVE
Ketones, ur: NEGATIVE
Leukocytes, UA: NEGATIVE
Nitrite: POSITIVE — AB
RBC / HPF: NONE SEEN (ref 0–?)
Specific Gravity, Urine: 1.015 (ref 1.000–1.030)
TOTAL PROTEIN, URINE-UPE24: NEGATIVE
Urine Glucose: NEGATIVE
Urobilinogen, UA: 0.2 (ref 0.0–1.0)
pH: 7 (ref 5.0–8.0)

## 2015-09-14 LAB — CBC
HEMATOCRIT: 43.1 % (ref 36.0–46.0)
HEMOGLOBIN: 14.3 g/dL (ref 12.0–15.0)
MCHC: 33.2 g/dL (ref 30.0–36.0)
MCV: 90.6 fl (ref 78.0–100.0)
Platelets: 199 10*3/uL (ref 150.0–400.0)
RBC: 4.76 Mil/uL (ref 3.87–5.11)
RDW: 12.8 % (ref 11.5–15.5)
WBC: 9 10*3/uL (ref 4.0–10.5)

## 2015-09-14 LAB — COMPREHENSIVE METABOLIC PANEL
ALBUMIN: 4.2 g/dL (ref 3.5–5.2)
ALT: 12 U/L (ref 0–35)
AST: 14 U/L (ref 0–37)
Alkaline Phosphatase: 52 U/L (ref 39–117)
BUN: 13 mg/dL (ref 6–23)
CALCIUM: 9.2 mg/dL (ref 8.4–10.5)
CHLORIDE: 106 meq/L (ref 96–112)
CO2: 27 mEq/L (ref 19–32)
Creatinine, Ser: 0.68 mg/dL (ref 0.40–1.20)
GFR: 95.06 mL/min (ref >60.00)
Glucose, Bld: 94 mg/dL (ref 70–99)
POTASSIUM: 4.5 meq/L (ref 3.5–5.1)
Sodium: 139 mEq/L (ref 135–145)
Total Bilirubin: 0.6 mg/dL (ref 0.2–1.2)
Total Protein: 7.2 g/dL (ref 6.0–8.3)

## 2015-09-14 LAB — HEPATITIS C ANTIBODY: HCV Ab: NEGATIVE

## 2015-09-14 MED ORDER — ESTROGENS, CONJUGATED 0.625 MG/GM VA CREA: TOPICAL_CREAM | VAGINAL | Status: DC

## 2015-09-14 MED ORDER — SIMVASTATIN 20 MG PO TABS: ORAL_TABLET | ORAL | Status: DC

## 2015-09-14 MED ORDER — ESCITALOPRAM OXALATE 10 MG PO TABS: 10.0000 mg | ORAL_TABLET | Freq: Every day | ORAL | Status: DC

## 2015-09-14 NOTE — Progress Notes (Signed)
Pre visit review using our clinic review tool, if applicable. No additional management support is needed unless otherwise documented below in the visit note. 

## 2015-09-14 NOTE — Assessment & Plan Note (Signed)
Change estrace pill to cream for easier use, doing better.

## 2015-09-14 NOTE — Assessment & Plan Note (Signed)
She has cut back and advised her to stop altogether. She will think about it, given information about the risks and harms of cigarette smoke and tips for quitting.

## 2015-09-14 NOTE — Patient Instructions (Addendum)
We will check the lab work today and send the results on mychart.   We have sent in refills for the medicine.   We have sent in a cream to replace the estrace pills. Use it daily for 2 weeks then go to using 2-3 times per week after that.   Keep up the good work with the health and go back to exercising.   The colonoscopy is due for repeat next year.   Smoking Cessation, Tips for Success If you are ready to quit smoking, congratulations! You have chosen to help yourself be healthier. Cigarettes bring nicotine, tar, carbon monoxide, and other irritants into your body. Your lungs, heart, and blood vessels will be able to work better without these poisons. There are many different ways to quit smoking. Nicotine gum, nicotine patches, a nicotine inhaler, or nicotine nasal spray can help with physical craving. Hypnosis, support groups, and medicines help break the habit of smoking. WHAT THINGS CAN I DO TO MAKE QUITTING EASIER?  Here are some tips to help you quit for good:  Pick a date when you will quit smoking completely. Tell all of your friends and family about your plan to quit on that date.  Do not try to slowly cut down on the number of cigarettes you are smoking. Pick a quit date and quit smoking completely starting on that day.  Throw away all cigarettes.   Clean and remove all ashtrays from your home, work, and car.  On a card, write down your reasons for quitting. Carry the card with you and read it when you get the urge to smoke.  Cleanse your body of nicotine. Drink enough water and fluids to keep your urine clear or pale yellow. Do this after quitting to flush the nicotine from your body.  Learn to predict your moods. Do not let a bad situation be your excuse to have a cigarette. Some situations in your life might tempt you into wanting a cigarette.  Never have "just one" cigarette. It leads to wanting another and another. Remind yourself of your decision to quit.  Change  habits associated with smoking. If you smoked while driving or when feeling stressed, try other activities to replace smoking. Stand up when drinking your coffee. Brush your teeth after eating. Sit in a different chair when you read the paper. Avoid alcohol while trying to quit, and try to drink fewer caffeinated beverages. Alcohol and caffeine may urge you to smoke.  Avoid foods and drinks that can trigger a desire to smoke, such as sugary or spicy foods and alcohol.  Ask people who smoke not to smoke around you.  Have something planned to do right after eating or having a cup of coffee. For example, plan to take a walk or exercise.  Try a relaxation exercise to calm you down and decrease your stress. Remember, you may be tense and nervous for the first 2 weeks after you quit, but this will pass.  Find new activities to keep your hands busy. Play with a pen, coin, or rubber band. Doodle or draw things on paper.  Brush your teeth right after eating. This will help cut down on the craving for the taste of tobacco after meals. You can also try mouthwash.   Use oral substitutes in place of cigarettes. Try using lemon drops, carrots, cinnamon sticks, or chewing gum. Keep them handy so they are available when you have the urge to smoke.  When you have the urge to smoke,  try deep breathing.  Designate your home as a nonsmoking area.  If you are a heavy smoker, ask your health care provider about a prescription for nicotine chewing gum. It can ease your withdrawal from nicotine.  Reward yourself. Set aside the cigarette money you save and buy yourself something nice.  Look for support from others. Join a support group or smoking cessation program. Ask someone at home or at work to help you with your plan to quit smoking.  Always ask yourself, "Do I need this cigarette or is this just a reflex?" Tell yourself, "Today, I choose not to smoke," or "I do not want to smoke." You are reminding yourself  of your decision to quit.  Do not replace cigarette smoking with electronic cigarettes (commonly called e-cigarettes). The safety of e-cigarettes is unknown, and some may contain harmful chemicals.  If you relapse, do not give up! Plan ahead and think about what you will do the next time you get the urge to smoke. HOW WILL I FEEL WHEN I QUIT SMOKING? You may have symptoms of withdrawal because your body is used to nicotine (the addictive substance in cigarettes). You may crave cigarettes, be irritable, feel very hungry, cough often, get headaches, or have difficulty concentrating. The withdrawal symptoms are only temporary. They are strongest when you first quit but will go away within 10-14 days. When withdrawal symptoms occur, stay in control. Think about your reasons for quitting. Remind yourself that these are signs that your body is healing and getting used to being without cigarettes. Remember that withdrawal symptoms are easier to treat than the major diseases that smoking can cause.  Even after the withdrawal is over, expect periodic urges to smoke. However, these cravings are generally short lived and will go away whether you smoke or not. Do not smoke! WHAT RESOURCES ARE AVAILABLE TO HELP ME QUIT SMOKING? Your health care provider can direct you to community resources or hospitals for support, which may include:  Group support.  Education.  Hypnosis.  Therapy.   This information is not intended to replace advice given to you by your health care provider. Make sure you discuss any questions you have with your health care provider.   Document Released: 06/01/2004 Document Revised: 09/24/2014 Document Reviewed: 02/19/2013 Elsevier Interactive Patient Education [employer].

## 2015-09-14 NOTE — Assessment & Plan Note (Signed)
Taking simvastatin 20 mg daily without side effects.

## 2015-09-14 NOTE — Assessment & Plan Note (Signed)
Reminded about the need for repeat colonoscopy next year, immunizations up to date. Checking labs and adjust as needed. Counseled to resume exercise. BP good.

## 2015-09-14 NOTE — Progress Notes (Signed)
   Subjective:    Patient ID: Sheila Harrington, female    DOB: 11-16-1960, 54 y.o.   MRN: [id number]  HPI The patient is a 54 YO female coming in for wellness. No new concerns. No side effects from medicine. Cut back on smoking but still smoking some. Not exercising since November (was doing zumba and wants to go back).  PMH, [facility], social history reviewed and updated.   Review of Systems  Constitutional: Negative for fever, diaphoresis, activity change, appetite change and fatigue.  HENT: Negative.   Eyes: Negative.   Respiratory: Negative for cough, chest tightness, shortness of breath and wheezing.   Cardiovascular: Negative for chest pain, palpitations and leg swelling.  Gastrointestinal: Negative for nausea, abdominal pain, diarrhea, constipation and abdominal distention.  Musculoskeletal: Negative.   Skin: Negative.   Neurological: Negative.   Psychiatric/Behavioral: Negative.       Objective:   Physical Exam  Constitutional: She is oriented to person, place, and time. She appears well-developed and well-nourished.  HENT:  Head: Normocephalic and atraumatic.  Eyes: EOM are normal.  Neck: Normal range of motion.  Cardiovascular: Normal rate, regular rhythm and intact distal pulses.   Pulmonary/Chest: Effort normal and breath sounds normal. No respiratory distress. She has no wheezes. She has no rales.  Abdominal: Soft. Bowel sounds are normal. She exhibits no distension. There is no tenderness. There is no rebound.  Musculoskeletal: She exhibits no edema.  Neurological: She is alert and oriented to person, place, and time. Coordination normal.  Skin: Skin is warm and dry.  Psychiatric: She has a normal mood and affect.   Filed Vitals:   09/14/15 0943  BP: 132/80  Pulse: 83  Temp: 98.4 F (36.9 C)  TempSrc: Oral  Resp: 12  Height: 5\' 6"  (1.676 m)  Weight: 147 lb (66.679 kg)  SpO2: 97%      Assessment & Plan:

## 2015-09-15 ENCOUNTER — Telehealth: Payer: Self-pay | Admitting: Internal Medicine

## 2015-09-15 ENCOUNTER — Other Ambulatory Visit: Payer: Self-pay | Admitting: Internal Medicine

## 2015-09-15 MED ORDER — CIPROFLOXACIN HCL 500 MG PO TABS: 500.0000 mg | ORAL_TABLET | Freq: Two times a day (BID) | ORAL | Status: DC

## 2015-09-15 NOTE — Telephone Encounter (Signed)
Please see result note 

## 2015-09-15 NOTE — Telephone Encounter (Signed)
Pt request lab result that was done yesterday. Please give her a call back

## 2015-12-06 ENCOUNTER — Telehealth: Payer: Self-pay | Admitting: Internal Medicine

## 2015-12-06 MED ORDER — BUSPIRONE HCL 10 MG PO TABS: 10.0000 mg | ORAL_TABLET | Freq: Two times a day (BID) | ORAL | Status: DC | PRN

## 2015-12-06 NOTE — Telephone Encounter (Signed)
Pt would like for you to call her. She has been having panic attacks and would like to talk with you. She can be reached at [phone]

## 2015-12-06 NOTE — Telephone Encounter (Signed)
Left message for patient to call me back. I need to inform her of the note below.

## 2015-12-06 NOTE — Telephone Encounter (Signed)
Patient said she had been taking Lexapro. It wasn't helping, so she stopped taking it. She said it worked for a while and then stopped working. She said she has been having panic attacks, four a week, sometimes more. Her mother in law passed away and she has a lot going on. She said in the past she took clonazepam and it helped. She wants to know if she can try clonazepam again.

## 2015-12-06 NOTE — Telephone Encounter (Signed)
Would recommend to go back on the lexapro and once on for 2 weeks we can increase the dosage if needed to help. We have sent in buspar which is a safe medicine to try for panic/anxiety. Would also recommend visit to evaluate how she is doing several weeks after starting the lexapro

## 2015-12-06 NOTE — Telephone Encounter (Signed)
Informed patient

## 2016-01-17 ENCOUNTER — Encounter: Payer: Self-pay | Admitting: Internal Medicine

## 2016-01-25 ENCOUNTER — Encounter: Payer: Self-pay | Admitting: Internal Medicine

## 2016-03-14 ENCOUNTER — Ambulatory Visit (AMBULATORY_SURGERY_CENTER): Payer: PRIVATE HEALTH INSURANCE

## 2016-03-14 VITALS — Ht 63.75 in | Wt 143.6 lb

## 2016-03-14 DIAGNOSIS — Z860100 Personal history of colon polyps, unspecified: Secondary | ICD-10-CM

## 2016-03-14 NOTE — Progress Notes (Signed)
No allergies to eggs or soy No past problems with anesthesia No diet meds No home oxygen  Declined emmi 

## 2016-03-28 ENCOUNTER — Encounter: Payer: Self-pay | Admitting: Internal Medicine

## 2016-03-28 ENCOUNTER — Ambulatory Visit (AMBULATORY_SURGERY_CENTER): Payer: PRIVATE HEALTH INSURANCE | Admitting: Internal Medicine

## 2016-03-28 VITALS — BP 119/77 | HR 75 | Temp 98.4°F | Resp 14 | Ht 66.0 in | Wt 147.0 lb

## 2016-03-28 DIAGNOSIS — Z86010 Personal history of colonic polyps: Secondary | ICD-10-CM

## 2016-03-28 HISTORY — PX: COLONOSCOPY WITH PROPOFOL: SHX5780

## 2016-03-28 MED ORDER — SODIUM CHLORIDE 0.9 % IV SOLN: 500.0000 mL | INTRAVENOUS | Status: DC

## 2016-03-28 NOTE — Op Note (Signed)
[facility] Patient Name: Sheila Harrington Procedure Date: 03/28/2016 8:06 AM MRN: [id number] Endoscopist: Gatha Mayer , MD Age: 55 Referring MD:  Date of Birth: 1961-02-01 Gender: Female Account #: [id number] Procedure:                Colonoscopy Indications:              High risk colon cancer surveillance: Personal                            history of colonic polyps Medicines:                Propofol per Anesthesia, Monitored Anesthesia Care Procedure:                Pre-Anesthesia Assessment:                           - Prior to the procedure, a History and Physical                            was performed, and patient medications and                            allergies were reviewed. The patient's tolerance of                            previous anesthesia was also reviewed. The risks                            and benefits of the procedure and the sedation                            options and risks were discussed with the patient.                            All questions were answered, and informed consent                            was obtained. Prior Anticoagulants: The patient has                            taken no previous anticoagulant or antiplatelet                            agents. ASA Grade Assessment: II - A patient with                            mild systemic disease. After reviewing the risks                            and benefits, the patient was deemed in                            satisfactory condition to undergo the procedure.  After obtaining informed consent, the colonoscope                            was passed under direct vision. Throughout the                            procedure, the patient's blood pressure, pulse, and                            oxygen saturations were monitored continuously. The                            [id number]) was introduced through the anus                            and  advanced to the the cecum, identified by                            appendiceal orifice and ileocecal valve. The                            ileocecal valve, appendiceal orifice, and rectum                            were photographed. The quality of the bowel                            preparation was excellent. The bowel preparation                            used was Miralax. Scope In: 8:13:32 AM Scope Out: 8:29:14 AM Scope Withdrawal Time: 0 hours 11 minutes 21 seconds  Total Procedure Duration: 0 hours 15 minutes 42 seconds  Findings:                 The perianal and digital rectal examinations were                            normal.                           Many small and large-mouthed diverticula were found                            in the entire colon. There was no evidence of                            diverticular bleeding.                           The exam was otherwise without abnormality on                            direct and retroflexion views. Complications:            No immediate complications. Estimated blood loss:  None. Estimated Blood Loss:     Estimated blood loss: none. Impression:               - Moderate diverticulosis in the entire examined                            colon. There was no evidence of diverticular                            bleeding.                           - The examination was otherwise normal on direct                            and retroflexion views.                           - No specimens collected.                           - Personal history of colonic polyps. Recommendation:           - Repeat colonoscopy in 5 years for surveillance.                           - Resume previous diet.                           - Continue present medications. Gatha Mayer, MD 03/28/2016 8:41:21 AM This report has been signed electronically.

## 2016-03-28 NOTE — Patient Instructions (Addendum)
No polyps today.  Your next routine colonoscopy should be in 5 years - 2022.  I appreciate the opportunity to care for you. Gatha Mayer, MD, FACG   YOU HAD AN ENDOSCOPIC PROCEDURE TODAY AT [facility]:   Refer to the procedure report that was given to you for any specific questions about what was found during the examination.  If the procedure report does not answer your questions, please call your gastroenterologist to clarify.  If you requested that your care partner not be given the details of your procedure findings, then the procedure report has been included in a sealed envelope for you to review at your convenience later.  YOU SHOULD EXPECT: Some feelings of bloating in the abdomen. Passage of more gas than usual.  Walking can help get rid of the air that was put into your GI tract during the procedure and reduce the bloating. If you had a lower endoscopy (such as a colonoscopy or flexible sigmoidoscopy) you may notice spotting of blood in your stool or on the toilet paper. If you underwent a bowel prep for your procedure, you may not have a normal bowel movement for a few days.  Please Note:  You might notice some irritation and congestion in your nose or some drainage.  This is from the oxygen used during your procedure.  There is no need for concern and it should clear up in a day or so.  SYMPTOMS TO REPORT IMMEDIATELY:   Following lower endoscopy (colonoscopy or flexible sigmoidoscopy):  Excessive amounts of blood in the stool  Significant tenderness or worsening of abdominal pains  Swelling of the abdomen that is new, acute  Fever of 100F or higher   For urgent or emergent issues, a gastroenterologist can be reached at any hour by calling [phone].   DIET: Your first meal following the procedure should be a small meal and then it is ok to progress to your normal diet. Heavy or fried foods are harder to digest and may make you feel nauseous  or bloated.  Likewise, meals heavy in dairy and vegetables can increase bloating.  Drink plenty of fluids but you should avoid alcoholic beverages for 24 hours.  ACTIVITY:  You should plan to take it easy for the rest of today and you should NOT DRIVE or use heavy machinery until tomorrow (because of the sedation medicines used during the test).    FOLLOW UP: Our staff will call the number listed on your records the next business day following your procedure to check on you and address any questions or concerns that you may have regarding the information given to you following your procedure. If we do not reach you, we will leave a message.  However, if you are feeling well and you are not experiencing any problems, there is no need to return our call.  We will assume that you have returned to your regular daily activities without incident.  If any biopsies were taken you will be contacted by phone or by letter within the next 1-3 weeks.  Please call us at [phone] if you have not heard about the biopsies in 3 weeks.    SIGNATURES/CONFIDENTIALITY: You and/or your care partner have signed paperwork which will be entered into your electronic medical record.  These signatures attest to the fact that that the information above on your After Visit Summary has been reviewed and is understood.  Full responsibility of the confidentiality of this discharge  information lies with you and/or your care-partner. 

## 2016-03-28 NOTE — Progress Notes (Signed)
A/ox3 pleased with MAC, report to Karen RN 

## 2016-03-29 ENCOUNTER — Telehealth: Payer: Self-pay | Admitting: *Deleted

## 2016-03-29 NOTE — Telephone Encounter (Signed)
  Follow up Call-  Call back number 03/28/2016  Post procedure Call Back phone  # [phone]  Permission to leave phone message Yes     Patient questions:  Do you have a fever, pain , or abdominal swelling? No. Pain Score  0 *  Have you tolerated food without any problems? Yes.    Have you been able to return to your normal activities? Yes.    Do you have any questions about your discharge instructions: Diet   No. Medications  No. Follow up visit  No.  Do you have questions or concerns about your Care? No.  Actions: * If pain score is 4 or above: No action needed, pain <4.

## 2016-04-09 ENCOUNTER — Encounter: Payer: Self-pay | Admitting: *Deleted

## 2016-07-31 ENCOUNTER — Encounter: Payer: Self-pay | Admitting: Internal Medicine

## 2016-09-07 ENCOUNTER — Other Ambulatory Visit: Payer: Self-pay | Admitting: Internal Medicine

## 2016-09-14 ENCOUNTER — Other Ambulatory Visit (INDEPENDENT_AMBULATORY_CARE_PROVIDER_SITE_OTHER): Payer: PRIVATE HEALTH INSURANCE

## 2016-09-14 ENCOUNTER — Ambulatory Visit (INDEPENDENT_AMBULATORY_CARE_PROVIDER_SITE_OTHER): Payer: PRIVATE HEALTH INSURANCE | Admitting: Internal Medicine

## 2016-09-14 ENCOUNTER — Encounter: Payer: Self-pay | Admitting: Internal Medicine

## 2016-09-14 VITALS — BP 134/78 | HR 76 | Temp 98.0°F | Resp 12 | Ht 66.0 in | Wt 146.0 lb

## 2016-09-14 DIAGNOSIS — F17200 Nicotine dependence, unspecified, uncomplicated: Secondary | ICD-10-CM | POA: Diagnosis not present

## 2016-09-14 DIAGNOSIS — E785 Hyperlipidemia, unspecified: Secondary | ICD-10-CM

## 2016-09-14 DIAGNOSIS — Z0000 Encounter for general adult medical examination without abnormal findings: Secondary | ICD-10-CM | POA: Diagnosis not present

## 2016-09-14 DIAGNOSIS — N952 Postmenopausal atrophic vaginitis: Secondary | ICD-10-CM

## 2016-09-14 LAB — COMPREHENSIVE METABOLIC PANEL
ALT: 12 U/L (ref 0–35)
AST: 12 U/L (ref 0–37)
Albumin: 4.4 g/dL (ref 3.5–5.2)
Alkaline Phosphatase: 52 U/L (ref 39–117)
BILIRUBIN TOTAL: 0.7 mg/dL (ref 0.2–1.2)
BUN: 13 mg/dL (ref 6–23)
CALCIUM: 9 mg/dL (ref 8.4–10.5)
CHLORIDE: 105 meq/L (ref 96–112)
CO2: 28 meq/L (ref 19–32)
Creatinine, Ser: 0.65 mg/dL (ref 0.40–1.20)
GFR: 99.79 mL/min (ref >60.00)
Glucose, Bld: 92 mg/dL (ref 70–99)
POTASSIUM: 4.1 meq/L (ref 3.5–5.1)
Sodium: 140 mEq/L (ref 135–145)
Total Protein: 7 g/dL (ref 6.0–8.3)

## 2016-09-14 LAB — URINALYSIS, ROUTINE W REFLEX MICROSCOPIC
BILIRUBIN URINE: NEGATIVE
Ketones, ur: NEGATIVE
LEUKOCYTES UA: NEGATIVE
NITRITE: POSITIVE — AB
Specific Gravity, Urine: 1.015 (ref 1.000–1.030)
TOTAL PROTEIN, URINE-UPE24: NEGATIVE
UROBILINOGEN UA: 0.2 (ref 0.0–1.0)
Urine Glucose: NEGATIVE
pH: 7.5 (ref 5.0–8.0)

## 2016-09-14 LAB — CBC
HCT: 42.8 % (ref 36.0–46.0)
Hemoglobin: 14.6 g/dL (ref 12.0–15.0)
MCHC: 34.1 g/dL (ref 30.0–36.0)
MCV: 89.3 fl (ref 78.0–100.0)
PLATELETS: 184 10*3/uL (ref 150.0–400.0)
RBC: 4.8 Mil/uL (ref 3.87–5.11)
RDW: 13.2 % (ref 11.5–15.5)
WBC: 7.3 10*3/uL (ref 4.0–10.5)

## 2016-09-14 LAB — LIPID PANEL
CHOL/HDL RATIO: 4
Cholesterol: 171 mg/dL (ref 0–200)
HDL: 43.9 mg/dL (ref >39.00)
LDL Cholesterol: 104 mg/dL — ABNORMAL HIGH (ref 0–99)
NonHDL: 126.76
TRIGLYCERIDES: 112 mg/dL (ref 0.0–149.0)
VLDL: 22.4 mg/dL (ref 0.0–40.0)

## 2016-09-14 MED ORDER — ESTRADIOL 0.1 MG/GM VA CREA: 1.0000 | TOPICAL_CREAM | Freq: Every day | VAGINAL | 12 refills | Status: DC

## 2016-09-14 NOTE — Assessment & Plan Note (Signed)
She has cut back slightly since last visit, talked about the harms and risks from smoking and the need to make an attempt to quit. She will think about it.

## 2016-09-14 NOTE — Progress Notes (Signed)
   Subjective:    Patient ID: Sheila Harrington, female    DOB: [birth year], 55 y.o.   MRN: [id number]  HPI The patient is a 55 YO female coming in for wellness. No new concerns.   PMH, [facility], social history reviewed and updated.   Review of Systems  Constitutional: Negative.   HENT: Negative.   Eyes: Negative.   Respiratory: Negative for cough, chest tightness and shortness of breath.   Cardiovascular: Negative for chest pain, palpitations and leg swelling.  Gastrointestinal: Negative for abdominal distention, abdominal pain, constipation, diarrhea, nausea and vomiting.  Musculoskeletal: Negative.   Skin: Negative.   Neurological: Negative.   Psychiatric/Behavioral: Negative.       Objective:   Physical Exam  Constitutional: She is oriented to person, place, and time. She appears well-developed and well-nourished.  HENT:  Head: Normocephalic and atraumatic.  Eyes: EOM are normal.  Neck: Normal range of motion.  Cardiovascular: Normal rate and regular rhythm.   Pulmonary/Chest: Effort normal and breath sounds normal. No respiratory distress. She has no wheezes. She has no rales.  Abdominal: Soft. Bowel sounds are normal. She exhibits no distension. There is no tenderness. There is no rebound.  Musculoskeletal: She exhibits no edema.  Neurological: She is alert and oriented to person, place, and time. Coordination normal.  Skin: Skin is warm and dry.  Psychiatric: She has a normal mood and affect.   Vitals:   09/14/16 0929  BP: 134/78  Pulse: 76  Resp: 12  Temp: 98 F (36.7 C)  TempSrc: Oral  SpO2: 98%  Weight: 146 lb (66.2 kg)  Height: 5\' 6"  (1.676 m)      Assessment & Plan:

## 2016-09-14 NOTE — Assessment & Plan Note (Signed)
Rx for estradiol cream.

## 2016-09-14 NOTE — Patient Instructions (Signed)
We have sent in the cream for you.   We are checking the labs and will send the results on mychart.   Keep thinking about quitting smoking for the health. The sooner you quit the better for the health.   Health Maintenance, Female Introduction Adopting a healthy lifestyle and getting preventive care can go a long way to promote health and wellness. Talk with your health care provider about what schedule of regular examinations is right for you. This is a good chance for you to check in with your provider about disease prevention and staying healthy. In between checkups, there are plenty of things you can do on your own. Experts have done a lot of research about which lifestyle changes and preventive measures are most likely to keep you healthy. Ask your health care provider for more information. Weight and diet Eat a healthy diet  Be sure to include plenty of vegetables, fruits, low-fat dairy products, and lean protein.  Do not eat a lot of foods high in solid fats, added sugars, or salt.  Get regular exercise. This is one of the most important things you can do for your health.  Most adults should exercise for at least 150 minutes each week. The exercise should increase your heart rate and make you sweat (moderate-intensity exercise).  Most adults should also do strengthening exercises at least twice a week. This is in addition to the moderate-intensity exercise. Maintain a healthy weight  Body mass index (BMI) is a measurement that can be used to identify possible weight problems. It estimates body fat based on height and weight. Your health care provider can help determine your BMI and help you achieve or maintain a healthy weight.  For females 75 years of age and older:  A BMI below 18.5 is considered underweight.  A BMI of 18.5 to 24.9 is normal.  A BMI of 25 to 29.9 is considered overweight.  A BMI of 30 and above is considered obese. Watch levels of cholesterol and blood  lipids  You should start having your blood tested for lipids and cholesterol at 58 years of age, then have this test every 5 years.  You may need to have your cholesterol levels checked more often if:  Your lipid or cholesterol levels are high.  You are older than 57 years of age.  You are at high risk for heart disease. Cancer screening Lung Cancer  Lung cancer screening is recommended for adults 66-35 years old who are at high risk for lung cancer because of a history of smoking.  A yearly low-dose CT scan of the lungs is recommended for people who:  Currently smoke.  Have quit within the past 15 years.  Have at least a 30-pack-year history of smoking. A pack year is smoking an average of one pack of cigarettes a day for 1 year.  Yearly screening should continue until it has been 15 years since you quit.  Yearly screening should stop if you develop a health problem that would prevent you from having lung cancer treatment. Breast Cancer  Practice breast self-awareness. This means understanding how your breasts normally appear and feel.  It also means doing regular breast self-exams. Let your health care provider know about any changes, no matter how small.  If you are in your 20s or 30s, you should have a clinical breast exam (CBE) by a health care provider every 1-3 years as part of a regular health exam.  If you are 40 or older,  have a CBE every year. Also consider having a breast X-ray (mammogram) every year.  If you have a family history of breast cancer, talk to your health care provider about genetic screening.  If you are at high risk for breast cancer, talk to your health care provider about having an MRI and a mammogram every year.  Breast cancer gene (BRCA) assessment is recommended for women who have family members with BRCA-related cancers. BRCA-related cancers include:  Breast.  Ovarian.  Tubal.  Peritoneal cancers.  Results of the assessment will  determine the need for genetic counseling and BRCA1 and BRCA2 testing. Cervical Cancer  Your health care provider may recommend that you be screened regularly for cancer of the pelvic organs (ovaries, uterus, and vagina). This screening involves a pelvic examination, including checking for microscopic changes to the surface of your cervix (Pap test). You may be encouraged to have this screening done every 3 years, beginning at age 49.  For women ages 13-65, health care providers may recommend pelvic exams and Pap testing every 3 years, or they may recommend the Pap and pelvic exam, combined with testing for human papilloma virus (HPV), every 5 years. Some types of HPV increase your risk of cervical cancer. Testing for HPV may also be done on women of any age with unclear Pap test results.  Other health care providers may not recommend any screening for nonpregnant women who are considered low risk for pelvic cancer and who do not have symptoms. Ask your health care provider if a screening pelvic exam is right for you.  If you have had past treatment for cervical cancer or a condition that could lead to cancer, you need Pap tests and screening for cancer for at least 20 years after your treatment. If Pap tests have been discontinued, your risk factors (such as having a new sexual partner) need to be reassessed to determine if screening should resume. Some women have medical problems that increase the chance of getting cervical cancer. In these cases, your health care provider may recommend more frequent screening and Pap tests. Colorectal Cancer  This type of cancer can be detected and often prevented.  Routine colorectal cancer screening usually begins at 57 years of age and continues through 57 years of age.  Your health care provider may recommend screening at an earlier age if you have risk factors for colon cancer.  Your health care provider may also recommend using home test kits to check for  hidden blood in the stool.  A small camera at the end of a tube can be used to examine your colon directly (sigmoidoscopy or colonoscopy). This is done to check for the earliest forms of colorectal cancer.  Routine screening usually begins at age 54.  Direct examination of the colon should be repeated every 5-10 years through 57 years of age. However, you may need to be screened more often if early forms of precancerous polyps or small growths are found. Skin Cancer  Check your skin from head to toe regularly.  Tell your health care provider about any new moles or changes in moles, especially if there is a change in a mole's shape or color.  Also tell your health care provider if you have a mole that is larger than the size of a pencil eraser.  Always use sunscreen. Apply sunscreen liberally and repeatedly throughout the day.  Protect yourself by wearing long sleeves, pants, a wide-brimmed hat, and sunglasses whenever you are outside. Heart disease, diabetes,  and high blood pressure  High blood pressure causes heart disease and increases the risk of stroke. High blood pressure is more likely to develop in:  People who have blood pressure in the high end of the normal range (130-139/85-89 mm Hg).  People who are overweight or obese.  People who are African American.  If you are 34-32 years of age, have your blood pressure checked every 3-5 years. If you are 61 years of age or older, have your blood pressure checked every year. You should have your blood pressure measured twice-once when you are at a hospital or clinic, and once when you are not at a hospital or clinic. Record the average of the two measurements. To check your blood pressure when you are not at a hospital or clinic, you can use:  An automated blood pressure machine at a pharmacy.  A home blood pressure monitor.  If you are between 26 years and 30 years old, ask your health care provider if you should take aspirin to  prevent strokes.  Have regular diabetes screenings. This involves taking a blood sample to check your fasting blood sugar level.  If you are at a normal weight and have a low risk for diabetes, have this test once every three years after 57 years of age.  If you are overweight and have a high risk for diabetes, consider being tested at a younger age or more often. Preventing infection Hepatitis B  If you have a higher risk for hepatitis B, you should be screened for this virus. You are considered at high risk for hepatitis B if:  You were born in a country where hepatitis B is common. Ask your health care provider which countries are considered high risk.  Your parents were born in a high-risk country, and you have not been immunized against hepatitis B (hepatitis B vaccine).  You have HIV or AIDS.  You use needles to inject street drugs.  You live with someone who has hepatitis B.  You have had sex with someone who has hepatitis B.  You get hemodialysis treatment.  You take certain medicines for conditions, including cancer, organ transplantation, and autoimmune conditions. Hepatitis C  Blood testing is recommended for:  Everyone born from [birth year] through 1967.  Anyone with known risk factors for hepatitis C. Sexually transmitted infections (STIs)  You should be screened for sexually transmitted infections (STIs) including gonorrhea and chlamydia if:  You are sexually active and are younger than 57 years of age.  You are older than 57 years of age and your health care provider tells you that you are at risk for this type of infection.  Your sexual activity has changed since you were last screened and you are at an increased risk for chlamydia or gonorrhea. Ask your health care provider if you are at risk.  If you do not have HIV, but are at risk, it may be recommended that you take a prescription medicine daily to prevent HIV infection. This is called pre-exposure  prophylaxis (PrEP). You are considered at risk if:  You are sexually active and do not regularly use condoms or know the HIV status of your partner(s).  You take drugs by injection.  You are sexually active with a partner who has HIV. Talk with your health care provider about whether you are at high risk of being infected with HIV. If you choose to begin PrEP, you should first be tested for HIV. You should then be tested every  3 months for as long as you are taking PrEP. Pregnancy  If you are premenopausal and you may become pregnant, ask your health care provider about preconception counseling.  If you may become pregnant, take 400 to 800 micrograms (mcg) of folic acid every day.  If you want to prevent pregnancy, talk to your health care provider about birth control (contraception). Osteoporosis and menopause  Osteoporosis is a disease in which the bones lose minerals and strength with aging. This can result in serious bone fractures. Your risk for osteoporosis can be identified using a bone density scan.  If you are 48 years of age or older, or if you are at risk for osteoporosis and fractures, ask your health care provider if you should be screened.  Ask your health care provider whether you should take a calcium or vitamin D supplement to lower your risk for osteoporosis.  Menopause may have certain physical symptoms and risks.  Hormone replacement therapy may reduce some of these symptoms and risks. Talk to your health care provider about whether hormone replacement therapy is right for you. Follow these instructions at home:  Schedule regular health, dental, and eye exams.  Stay current with your immunizations.  Do not use any tobacco products including cigarettes, chewing tobacco, or electronic cigarettes.  If you are pregnant, do not drink alcohol.  If you are breastfeeding, limit how much and how often you drink alcohol.  Limit alcohol intake to no more than 1 drink  per day for nonpregnant women. One drink equals 12 ounces of beer, 5 ounces of wine, or 1 ounces of hard liquor.  Do not use street drugs.  Do not share needles.  Ask your health care provider for help if you need support or information about quitting drugs.  Tell your health care provider if you often feel depressed.  Tell your health care provider if you have ever been abused or do not feel safe at home. This information is not intended to replace advice given to you by your health care provider. Make sure you discuss any questions you have with your health care provider. Document Released: 03/19/2011 Document Revised: 02/09/2016 Document Reviewed: 06/07/2015  2017 [employer]vier

## 2016-09-14 NOTE — Assessment & Plan Note (Signed)
Taking simvastatin 20 mg daily and checking lipid panel and adjust as needed.  

## 2016-09-14 NOTE — Progress Notes (Signed)
Pre visit review using our clinic review tool, if applicable. No additional management support is needed unless otherwise documented below in the visit note. 

## 2016-09-14 NOTE — Assessment & Plan Note (Signed)
Colonoscopy done this year and due again in 5 years for hx polyps. Flu shot done already. Tdap up to date. Counseled about sun safety and mole surveillance. Counseled about the dangers of distracted driving. Given screening recommendations.

## 2016-09-18 ENCOUNTER — Other Ambulatory Visit: Payer: Self-pay | Admitting: Internal Medicine

## 2016-09-18 MED ORDER — NITROFURANTOIN MONOHYD MACRO 100 MG PO CAPS: 100.0000 mg | ORAL_CAPSULE | Freq: Two times a day (BID) | ORAL | 0 refills | Status: DC

## 2016-10-16 ENCOUNTER — Other Ambulatory Visit: Payer: Self-pay | Admitting: Internal Medicine

## 2016-11-16 ENCOUNTER — Other Ambulatory Visit: Payer: Self-pay | Admitting: Internal Medicine

## 2016-11-30 ENCOUNTER — Other Ambulatory Visit: Payer: Self-pay | Admitting: Internal Medicine

## 2016-12-10 ENCOUNTER — Encounter: Payer: Self-pay | Admitting: Internal Medicine

## 2016-12-10 ENCOUNTER — Ambulatory Visit (INDEPENDENT_AMBULATORY_CARE_PROVIDER_SITE_OTHER): Payer: PRIVATE HEALTH INSURANCE | Admitting: Internal Medicine

## 2016-12-10 DIAGNOSIS — F4323 Adjustment disorder with mixed anxiety and depressed mood: Secondary | ICD-10-CM | POA: Insufficient documentation

## 2016-12-10 MED ORDER — BUSPIRONE HCL 5 MG PO TABS: 5.0000 mg | ORAL_TABLET | Freq: Two times a day (BID) | ORAL | 6 refills | Status: DC | PRN

## 2016-12-10 MED ORDER — ESCITALOPRAM OXALATE 10 MG PO TABS: ORAL_TABLET | ORAL | 6 refills | Status: DC

## 2016-12-10 NOTE — Assessment & Plan Note (Signed)
Increase lexapro to 20 mg daily and add buspar as needed BID for anxiety.

## 2016-12-10 NOTE — Patient Instructions (Signed)
We have sent in the buspar for the anxiety which you can take as needed up to twice a day.   You can also increase the lexapro to 2 pills daily to see if this helps more.

## 2016-12-10 NOTE — Progress Notes (Signed)
Pre visit review using our clinic review tool, if applicable. No additional management support is needed unless otherwise documented below in the visit note. 

## 2016-12-10 NOTE — Progress Notes (Signed)
   Subjective:    Patient ID: Sheila Harrington, female    DOB: [birth year], 56 y.o.   MRN: [id number]  HPI The patient is a 56 YO female coming in for increasing anxiety and depression. She is having more stress with her daughter which is causing more anxiety. She is still taking lexapro but this is not helping like it used to. She denies SI/HI. Having some problems with anxiety during the day. Some more problems with sleeping at night time. Previously lexapro was working well. Not exercising right now. Still smoking.   Review of Systems  Constitutional: Negative.   Respiratory: Negative.   Cardiovascular: Negative.   Gastrointestinal: Negative.   Musculoskeletal: Negative.   Skin: Negative.   Psychiatric/Behavioral: Positive for decreased concentration, dysphoric mood and sleep disturbance. Negative for self-injury and suicidal ideas. The patient is nervous/anxious.       Objective:   Physical Exam  Constitutional: She is oriented to person, place, and time. She appears well-developed and well-nourished.  HENT:  Head: Normocephalic and atraumatic.  Eyes: EOM are normal.  Cardiovascular: Normal rate and regular rhythm.   Pulmonary/Chest: Effort normal and breath sounds normal.  Abdominal: Soft.  Neurological: She is alert and oriented to person, place, and time. Coordination normal.  Skin: Skin is warm and dry.  Psychiatric:  Some flat affect   Vitals:   12/10/16 1050  BP: (!) 142/78  Pulse: 85  Resp: 14  Temp: 98 F (36.7 C)  TempSrc: Oral  SpO2: 98%  Weight: 147 lb (66.7 kg)  Height: 5\' 6"  (1.676 m)     Assessment & Plan:

## 2016-12-11 ENCOUNTER — Other Ambulatory Visit: Payer: Self-pay | Admitting: Internal Medicine

## 2017-03-19 ENCOUNTER — Other Ambulatory Visit: Payer: Self-pay | Admitting: Internal Medicine

## 2017-05-16 ENCOUNTER — Other Ambulatory Visit: Payer: Self-pay | Admitting: Internal Medicine

## 2017-06-11 ENCOUNTER — Other Ambulatory Visit: Payer: Self-pay | Admitting: Internal Medicine

## 2017-07-09 ENCOUNTER — Encounter: Payer: Self-pay | Admitting: Internal Medicine

## 2017-07-09 ENCOUNTER — Ambulatory Visit (INDEPENDENT_AMBULATORY_CARE_PROVIDER_SITE_OTHER): Payer: PRIVATE HEALTH INSURANCE | Admitting: Internal Medicine

## 2017-07-09 ENCOUNTER — Telehealth: Payer: Self-pay | Admitting: Internal Medicine

## 2017-07-09 ENCOUNTER — Other Ambulatory Visit (INDEPENDENT_AMBULATORY_CARE_PROVIDER_SITE_OTHER): Payer: PRIVATE HEALTH INSURANCE

## 2017-07-09 VITALS — BP 128/82 | HR 88 | Temp 97.9°F | Ht 66.0 in | Wt 154.0 lb

## 2017-07-09 DIAGNOSIS — R109 Unspecified abdominal pain: Secondary | ICD-10-CM

## 2017-07-09 DIAGNOSIS — R1032 Left lower quadrant pain: Secondary | ICD-10-CM

## 2017-07-09 LAB — COMPREHENSIVE METABOLIC PANEL
ALBUMIN: 4.4 g/dL (ref 3.5–5.2)
ALT: 15 U/L (ref 0–35)
AST: 15 U/L (ref 0–37)
Alkaline Phosphatase: 52 U/L (ref 39–117)
BILIRUBIN TOTAL: 0.4 mg/dL (ref 0.2–1.2)
BUN: 11 mg/dL (ref 6–23)
CHLORIDE: 103 meq/L (ref 96–112)
CO2: 28 meq/L (ref 19–32)
CREATININE: 0.63 mg/dL (ref 0.40–1.20)
Calcium: 9.3 mg/dL (ref 8.4–10.5)
GFR: 103.15 mL/min (ref >60.00)
Glucose, Bld: 107 mg/dL — ABNORMAL HIGH (ref 70–99)
Potassium: 3.9 mEq/L (ref 3.5–5.1)
Sodium: 138 mEq/L (ref 135–145)
Total Protein: 7.5 g/dL (ref 6.0–8.3)

## 2017-07-09 LAB — CBC
HCT: 43.4 % (ref 36.0–46.0)
Hemoglobin: 14.4 g/dL (ref 12.0–15.0)
MCHC: 33.2 g/dL (ref 30.0–36.0)
MCV: 91.7 fl (ref 78.0–100.0)
PLATELETS: 211 10*3/uL (ref 150.0–400.0)
RBC: 4.73 Mil/uL (ref 3.87–5.11)
RDW: 13.2 % (ref 11.5–15.5)
WBC: 7.3 10*3/uL (ref 4.0–10.5)

## 2017-07-09 LAB — POCT URINALYSIS DIPSTICK
Bilirubin, UA: NEGATIVE
GLUCOSE UA: NEGATIVE
KETONES UA: NEGATIVE
Leukocytes, UA: NEGATIVE
NITRITE UA: NEGATIVE
PH UA: 7 (ref 5.0–8.0)
Protein, UA: NEGATIVE
RBC UA: NEGATIVE
Spec Grav, UA: 1.01 (ref 1.010–1.025)
Urobilinogen, UA: 0.2 E.U./dL

## 2017-07-09 LAB — LIPASE: LIPASE: 54 U/L (ref 11.0–59.0)

## 2017-07-09 NOTE — Patient Instructions (Signed)
You do not have a bladder infection. Keep using the tylenol for the pain.   We are checking the labs today to check for other problems but this could be a muscle.   Make sure to stay hydrated.

## 2017-07-09 NOTE — Telephone Encounter (Signed)
[facility] - Client TELEPHONE [facility]  Patient Name: Sheila Harrington  DOB: 14-Dec-1960    Initial Comment Caller states she has pain in the left side wrapping around to the back.   Nurse Assessment  Nurse: Neena Rhymes, RN, Sharyn Lull Date/Time (Eastern Time): 07/09/2017 8:03:42 AM  Confirm and document reason for call. If symptomatic, describe symptoms. ---Caller states she has pain in the left lower abdomen since Sunday by hip wrapping around to the low back. Denies fever, hematuria, or dysuria. SHe states she has had kidney infections before and this is what it has felt like. Last one 5 or 6 months ago.  Does the patient have any new or worsening symptoms? ---Yes  Will a triage be completed? ---Yes  Related visit to physician within the last 2 weeks? ---No  Does the PT have any chronic conditions? (i.e. diabetes, asthma, etc.) ---Yes  List chronic conditions. ---High Cholesterol.  Is this a behavioral health or substance abuse call? ---No     Guidelines    Guideline Title Affirmed Question Affirmed Notes  Abdominal Pain - Female [1] MILD pain (e.g., does not interfere with normal activities) AND [2] pain comes and goes (cramps) AND [3] present > 48 hours    Final Disposition User   See Physician within 24 Hours [place], RN, Sharyn Lull    Comments  Caller scheduled with Lesly Rubenstein at [facility] at the [place] office for today.   Referrals  REFERRED TO PCP OFFICE   PreDisposition Call another nurse

## 2017-07-11 ENCOUNTER — Other Ambulatory Visit: Payer: Self-pay | Admitting: Internal Medicine

## 2017-07-12 DIAGNOSIS — R109 Unspecified abdominal pain: Secondary | ICD-10-CM | POA: Insufficient documentation

## 2017-07-12 NOTE — Assessment & Plan Note (Signed)
U/A in the office without signs of infection or kidney stone. Checking CMP, CBC, lipase for any signs of infection. Appears to be muscular and continue tylenol for pain.

## 2017-07-12 NOTE — Progress Notes (Signed)
   Subjective:    Patient ID: Sheila Harrington, female    DOB: 1961-01-18, 56 y.o.   MRN: [id number]  HPI The patient is a 56 YO female coming in for left flank pain. She denies fevers or chills. Is concerned about a bladder infection. Denies dysuria but is having frequency. She denies diarrhea or constipation. No blood in stool. She does have pain in the left side and around to the back. Mild nausea but no vomiting. She is still eating and drinking well. Has had kidney stones in the past but this does not feel like that. She is worse with moving or lying a certain way. She has taken tylenol which helped some. Started 2 days ago and is stable since that time.   Review of Systems  Constitutional: Negative.   Respiratory: Negative for cough, chest tightness and shortness of breath.   Cardiovascular: Negative for chest pain, palpitations and leg swelling.  Gastrointestinal: Positive for abdominal pain. Negative for abdominal distention, constipation, diarrhea, nausea and vomiting.  Genitourinary: Positive for flank pain and frequency. Negative for difficulty urinating, dyspareunia, dysuria, hematuria, pelvic pain and urgency.  Skin: Negative.   Neurological: Negative.       Objective:   Physical Exam  Constitutional: She is oriented to person, place, and time. She appears well-developed and well-nourished.  HENT:  Head: Normocephalic and atraumatic.  Eyes: EOM are normal.  Neck: Normal range of motion.  Cardiovascular: Normal rate and regular rhythm.   Pulmonary/Chest: Effort normal and breath sounds normal. No respiratory distress. She has no wheezes. She has no rales.  Abdominal: Soft. Bowel sounds are normal. She exhibits no distension. There is tenderness. There is no rebound.  Left flank pain mild, no radiation. No LLQ tenderness on exam.   Musculoskeletal: She exhibits no edema.  Neurological: She is alert and oriented to person, place, and time. Coordination normal.  Skin: Skin is  warm and dry.   Vitals:   07/09/17 1604  BP: 128/82  Pulse: 88  Temp: 97.9 F (36.6 C)  TempSrc: Oral  SpO2: 95%  Weight: 154 lb (69.9 kg)  Height: 5\' 6"  (1.676 m)      Assessment & Plan:

## 2017-08-05 LAB — HM MAMMOGRAPHY

## 2017-08-07 ENCOUNTER — Encounter: Payer: Self-pay | Admitting: Internal Medicine

## 2017-08-07 NOTE — Progress Notes (Signed)
Abstracted and sent to scan  

## 2017-09-11 ENCOUNTER — Other Ambulatory Visit: Payer: Self-pay | Admitting: Internal Medicine

## 2017-09-20 ENCOUNTER — Encounter: Payer: Self-pay | Admitting: Internal Medicine

## 2017-09-20 ENCOUNTER — Ambulatory Visit (INDEPENDENT_AMBULATORY_CARE_PROVIDER_SITE_OTHER): Payer: PRIVATE HEALTH INSURANCE | Admitting: Internal Medicine

## 2017-09-20 ENCOUNTER — Other Ambulatory Visit (INDEPENDENT_AMBULATORY_CARE_PROVIDER_SITE_OTHER): Payer: PRIVATE HEALTH INSURANCE

## 2017-09-20 VITALS — BP 138/80 | HR 85 | Temp 97.8°F | Ht 66.0 in | Wt 153.0 lb

## 2017-09-20 DIAGNOSIS — Z0000 Encounter for general adult medical examination without abnormal findings: Secondary | ICD-10-CM | POA: Diagnosis not present

## 2017-09-20 DIAGNOSIS — F17200 Nicotine dependence, unspecified, uncomplicated: Secondary | ICD-10-CM | POA: Diagnosis not present

## 2017-09-20 DIAGNOSIS — E785 Hyperlipidemia, unspecified: Secondary | ICD-10-CM

## 2017-09-20 DIAGNOSIS — N952 Postmenopausal atrophic vaginitis: Secondary | ICD-10-CM | POA: Diagnosis not present

## 2017-09-20 LAB — CBC
HCT: 43.8 % (ref 36.0–46.0)
Hemoglobin: 14.5 g/dL (ref 12.0–15.0)
MCHC: 33.1 g/dL (ref 30.0–36.0)
MCV: 91 fl (ref 78.0–100.0)
Platelets: 221 10*3/uL (ref 150.0–400.0)
RBC: 4.81 Mil/uL (ref 3.87–5.11)
RDW: 12.9 % (ref 11.5–15.5)
WBC: 7.8 10*3/uL (ref 4.0–10.5)

## 2017-09-20 LAB — LIPID PANEL
CHOLESTEROL: 153 mg/dL (ref 0–200)
HDL: 36.9 mg/dL — ABNORMAL LOW (ref >39.00)
LDL Cholesterol: 96 mg/dL (ref 0–99)
NonHDL: 116.06
Total CHOL/HDL Ratio: 4
Triglycerides: 98 mg/dL (ref 0.0–149.0)
VLDL: 19.6 mg/dL (ref 0.0–40.0)

## 2017-09-20 LAB — COMPREHENSIVE METABOLIC PANEL
ALBUMIN: 4.3 g/dL (ref 3.5–5.2)
ALK PHOS: 50 U/L (ref 39–117)
ALT: 12 U/L (ref 0–35)
AST: 12 U/L (ref 0–37)
BILIRUBIN TOTAL: 0.6 mg/dL (ref 0.2–1.2)
BUN: 14 mg/dL (ref 6–23)
CO2: 27 mEq/L (ref 19–32)
CREATININE: 0.7 mg/dL (ref 0.40–1.20)
Calcium: 8.8 mg/dL (ref 8.4–10.5)
Chloride: 104 mEq/L (ref 96–112)
GFR: 91.28 mL/min (ref >60.00)
Glucose, Bld: 92 mg/dL (ref 70–99)
POTASSIUM: 4.1 meq/L (ref 3.5–5.1)
Sodium: 139 mEq/L (ref 135–145)
TOTAL PROTEIN: 6.9 g/dL (ref 6.0–8.3)

## 2017-09-20 NOTE — Assessment & Plan Note (Signed)
Time spent counseling about tobacco usage: 3 minutes. I have asked about smoking and is smoking same as usual. The patient is advised to quit. The patient is not willing to quit. They would like to try to quit in the next 12 months. We will follow up with them in 12 months.  

## 2017-09-20 NOTE — Patient Instructions (Signed)
We will check the labs today.  Let us know if you want to get the shingles vaccine shingrix at the office and we can put you on the waiting list.   Health Maintenance, Female Adopting a healthy lifestyle and getting preventive care can go a long way to promote health and wellness. Talk with your health care provider about what schedule of regular examinations is right for you. This is a good chance for you to check in with your provider about disease prevention and staying healthy. In between checkups, there are plenty of things you can do on your own. Experts have done a lot of research about which lifestyle changes and preventive measures are most likely to keep you healthy. Ask your health care provider for more information. Weight and diet Eat a healthy diet  Be sure to include plenty of vegetables, fruits, low-fat dairy products, and lean protein.  Do not eat a lot of foods high in solid fats, added sugars, or salt.  Get regular exercise. This is one of the most important things you can do for your health. ? Most adults should exercise for at least 150 minutes each week. The exercise should increase your heart rate and make you sweat (moderate-intensity exercise). ? Most adults should also do strengthening exercises at least twice a week. This is in addition to the moderate-intensity exercise.  Maintain a healthy weight  Body mass index (BMI) is a measurement that can be used to identify possible weight problems. It estimates body fat based on height and weight. Your health care provider can help determine your BMI and help you achieve or maintain a healthy weight.  For females 77 years of age and older: ? A BMI below 18.5 is considered underweight. ? A BMI of 18.5 to 24.9 is normal. ? A BMI of 25 to 29.9 is considered overweight. ? A BMI of 30 and above is considered obese.  Watch levels of cholesterol and blood lipids  You should start having your blood tested for lipids and  cholesterol at 59 years of age, then have this test every 5 years.  You may need to have your cholesterol levels checked more often if: ? Your lipid or cholesterol levels are high. ? You are older than 59 years of age. ? You are at high risk for heart disease.  Cancer screening Lung Cancer  Lung cancer screening is recommended for adults 61-59 years old who are at high risk for lung cancer because of a history of smoking.  A yearly low-dose CT scan of the lungs is recommended for people who: ? Currently smoke. ? Have quit within the past 15 years. ? Have at least a 30-pack-year history of smoking. A pack year is smoking an average of one pack of cigarettes a day for 1 year.  Yearly screening should continue until it has been 15 years since you quit.  Yearly screening should stop if you develop a health problem that would prevent you from having lung cancer treatment.  Breast Cancer  Practice breast self-awareness. This means understanding how your breasts normally appear and feel.  It also means doing regular breast self-exams. Let your health care provider know about any changes, no matter how small.  If you are in your 20s or 30s, you should have a clinical breast exam (CBE) by a health care provider every 1-3 years as part of a regular health exam.  If you are 28 or older, have a CBE every year. Also consider  having a breast X-ray (mammogram) every year.  If you have a family history of breast cancer, talk to your health care provider about genetic screening.  If you are at high risk for breast cancer, talk to your health care provider about having an MRI and a mammogram every year.  Breast cancer gene (BRCA) assessment is recommended for women who have family members with BRCA-related cancers. BRCA-related cancers include: ? Breast. ? Ovarian. ? Tubal. ? Peritoneal cancers.  Results of the assessment will determine the need for genetic counseling and BRCA1 and BRCA2  testing.  Cervical Cancer Your health care provider may recommend that you be screened regularly for cancer of the pelvic organs (ovaries, uterus, and vagina). This screening involves a pelvic examination, including checking for microscopic changes to the surface of your cervix (Pap test). You may be encouraged to have this screening done every 3 years, beginning at age 78.  For women ages 58-65, health care providers may recommend pelvic exams and Pap testing every 3 years, or they may recommend the Pap and pelvic exam, combined with testing for human papilloma virus (HPV), every 5 years. Some types of HPV increase your risk of cervical cancer. Testing for HPV may also be done on women of any age with unclear Pap test results.  Other health care providers may not recommend any screening for nonpregnant women who are considered low risk for pelvic cancer and who do not have symptoms. Ask your health care provider if a screening pelvic exam is right for you.  If you have had past treatment for cervical cancer or a condition that could lead to cancer, you need Pap tests and screening for cancer for at least 20 years after your treatment. If Pap tests have been discontinued, your risk factors (such as having a new sexual partner) need to be reassessed to determine if screening should resume. Some women have medical problems that increase the chance of getting cervical cancer. In these cases, your health care provider may recommend more frequent screening and Pap tests.  Colorectal Cancer  This type of cancer can be detected and often prevented.  Routine colorectal cancer screening usually begins at 59 years of age and continues through 59 years of age.  Your health care provider may recommend screening at an earlier age if you have risk factors for colon cancer.  Your health care provider may also recommend using home test kits to check for hidden blood in the stool.  A small camera at the end of a  tube can be used to examine your colon directly (sigmoidoscopy or colonoscopy). This is done to check for the earliest forms of colorectal cancer.  Routine screening usually begins at age 25.  Direct examination of the colon should be repeated every 5-10 years through 59 years of age. However, you may need to be screened more often if early forms of precancerous polyps or small growths are found.  Skin Cancer  Check your skin from head to toe regularly.  Tell your health care provider about any new moles or changes in moles, especially if there is a change in a mole's shape or color.  Also tell your health care provider if you have a mole that is larger than the size of a pencil eraser.  Always use sunscreen. Apply sunscreen liberally and repeatedly throughout the day.  Protect yourself by wearing long sleeves, pants, a wide-brimmed hat, and sunglasses whenever you are outside.  Heart disease, diabetes, and high blood pressure  High blood pressure causes heart disease and increases the risk of stroke. High blood pressure is more likely to develop in: ? People who have blood pressure in the high end of the normal range (130-139/85-89 mm Hg). ? People who are overweight or obese. ? People who are African American.  If you are 97-43 years of age, have your blood pressure checked every 3-5 years. If you are 34 years of age or older, have your blood pressure checked every year. You should have your blood pressure measured twice-once when you are at a hospital or clinic, and once when you are not at a hospital or clinic. Record the average of the two measurements. To check your blood pressure when you are not at a hospital or clinic, you can use: ? An automated blood pressure machine at a pharmacy. ? A home blood pressure monitor.  If you are between 39 years and 85 years old, ask your health care provider if you should take aspirin to prevent strokes.  Have regular diabetes screenings. This  involves taking a blood sample to check your fasting blood sugar level. ? If you are at a normal weight and have a low risk for diabetes, have this test once every three years after 59 years of age. ? If you are overweight and have a high risk for diabetes, consider being tested at a younger age or more often. Preventing infection Hepatitis B  If you have a higher risk for hepatitis B, you should be screened for this virus. You are considered at high risk for hepatitis B if: ? You were born in a country where hepatitis B is common. Ask your health care provider which countries are considered high risk. ? Your parents were born in a high-risk country, and you have not been immunized against hepatitis B (hepatitis B vaccine). ? You have HIV or AIDS. ? You use needles to inject street drugs. ? You live with someone who has hepatitis B. ? You have had sex with someone who has hepatitis B. ? You get hemodialysis treatment. ? You take certain medicines for conditions, including cancer, organ transplantation, and autoimmune conditions.  Hepatitis C  Blood testing is recommended for: ? Everyone born from [birth year] through 1967. ? Anyone with known risk factors for hepatitis C.  Sexually transmitted infections (STIs)  You should be screened for sexually transmitted infections (STIs) including gonorrhea and chlamydia if: ? You are sexually active and are younger than 59 years of age. ? You are older than 59 years of age and your health care provider tells you that you are at risk for this type of infection. ? Your sexual activity has changed since you were last screened and you are at an increased risk for chlamydia or gonorrhea. Ask your health care provider if you are at risk.  If you do not have HIV, but are at risk, it may be recommended that you take a prescription medicine daily to prevent HIV infection. This is called pre-exposure prophylaxis (PrEP). You are considered at risk if: ? You are  sexually active and do not regularly use condoms or know the HIV status of your partner(s). ? You take drugs by injection. ? You are sexually active with a partner who has HIV.  Talk with your health care provider about whether you are at high risk of being infected with HIV. If you choose to begin PrEP, you should first be tested for HIV. You should then be tested every 3 months  for as long as you are taking PrEP. Pregnancy  If you are premenopausal and you may become pregnant, ask your health care provider about preconception counseling.  If you may become pregnant, take 400 to 800 micrograms (mcg) of folic acid every day.  If you want to prevent pregnancy, talk to your health care provider about birth control (contraception). Osteoporosis and menopause  Osteoporosis is a disease in which the bones lose minerals and strength with aging. This can result in serious bone fractures. Your risk for osteoporosis can be identified using a bone density scan.  If you are 29 years of age or older, or if you are at risk for osteoporosis and fractures, ask your health care provider if you should be screened.  Ask your health care provider whether you should take a calcium or vitamin D supplement to lower your risk for osteoporosis.  Menopause may have certain physical symptoms and risks.  Hormone replacement therapy may reduce some of these symptoms and risks. Talk to your health care provider about whether hormone replacement therapy is right for you. Follow these instructions at home:  Schedule regular health, dental, and eye exams.  Stay current with your immunizations.  Do not use any tobacco products including cigarettes, chewing tobacco, or electronic cigarettes.  If you are pregnant, do not drink alcohol.  If you are breastfeeding, limit how much and how often you drink alcohol.  Limit alcohol intake to no more than 1 drink per day for nonpregnant women. One drink equals 12 ounces of  beer, 5 ounces of wine, or 1 ounces of hard liquor.  Do not use street drugs.  Do not share needles.  Ask your health care provider for help if you need support or information about quitting drugs.  Tell your health care provider if you often feel depressed.  Tell your health care provider if you have ever been abused or do not feel safe at home. This information is not intended to replace advice given to you by your health care provider. Make sure you discuss any questions you have with your health care provider. Document Released: 03/19/2011 Document Revised: 02/09/2016 Document Reviewed: 06/07/2015 Elsevier Interactive Patient Education  [employer].

## 2017-09-20 NOTE — Progress Notes (Signed)
   Subjective:    Patient ID: Sheila Harrington, female    DOB: 03-30-1961, 57 y.o.   MRN: [id number]  HPI The patient is a 57 YO female coming in for wellness.   Review of Systems  Constitutional: Negative.   HENT: Negative.   Eyes: Negative.   Respiratory: Negative for cough, chest tightness and shortness of breath.   Cardiovascular: Negative for chest pain, palpitations and leg swelling.  Gastrointestinal: Negative for abdominal distention, abdominal pain, constipation, diarrhea, nausea and vomiting.  Musculoskeletal: Negative.   Skin: Negative.   Neurological: Negative.   Psychiatric/Behavioral: Negative.       Objective:   Physical Exam  Constitutional: She is oriented to person, place, and time. She appears well-developed and well-nourished.  HENT:  Head: Normocephalic and atraumatic.  Eyes: EOM are normal.  Neck: Normal range of motion.  Cardiovascular: Normal rate and regular rhythm.  Pulmonary/Chest: Effort normal and breath sounds normal. No respiratory distress. She has no wheezes. She has no rales.  Abdominal: Soft. Bowel sounds are normal. She exhibits no distension. There is no tenderness. There is no rebound.  Musculoskeletal: She exhibits no edema.  Neurological: She is alert and oriented to person, place, and time. Coordination normal.  Skin: Skin is warm and dry.  Psychiatric: She has a normal mood and affect.   Vitals:   09/20/17 0809  BP: 138/80  Pulse: 85  Temp: 97.8 F (36.6 C)  TempSrc: Oral  SpO2: 96%  Weight: 153 lb (69.4 kg)  Height: 5\' 6"  (1.676 m)      Assessment & Plan:

## 2017-09-20 NOTE — Assessment & Plan Note (Signed)
Taking estrace with good relief

## 2017-09-20 NOTE — Assessment & Plan Note (Signed)
Flu shot and tetanus up to date. Counseled about shingrix. Colonoscopy and mammogram up to date. Needs pap smear and she will get gyn as hers retired. Counseled about sun safety and mole surveillance as well as dangers of distracted driving. Given screening recommendations.

## 2017-09-20 NOTE — Assessment & Plan Note (Signed)
Taking simvastatin 20 mg daily, checking lipid panel and adjust as needed.  

## 2017-10-10 ENCOUNTER — Other Ambulatory Visit: Payer: Self-pay | Admitting: Internal Medicine

## 2017-10-18 ENCOUNTER — Telehealth: Payer: Self-pay | Admitting: Internal Medicine

## 2017-10-18 NOTE — Telephone Encounter (Signed)
CRM for notification. See Telephone encounter for:   10/18/17.   Relation to pt: self Call back number: [phone] Pharmacy: [facility], [place] [phone] (Phone) [phone] (Fax)   Reason for call:  Patient last seen 09/20/17 stating PCP would prescribe escitalopram (LEXAPRO) 10 MG tablet alternate, please advise

## 2017-10-18 NOTE — Telephone Encounter (Signed)
Called pt back no answer L:MOM RTC.../lmb 

## 2017-10-18 NOTE — Telephone Encounter (Signed)
My apologies I do not recall. Was she having side effects or just felt it was not working?

## 2017-10-21 NOTE — Telephone Encounter (Signed)
Called pt clarified msg below. Pt states she is wanting something to take as needed for her anxiety issues. She is not wanting to take anything everyday. Which she states has not been taking the escitalopram at all. Removed from med list, and inform pt MD is out of the office today, but once she reply back tomorrow will give her a call a bck w/her response...Johny Chess

## 2017-10-22 MED ORDER — BUSPIRONE HCL 5 MG PO TABS: 5.0000 mg | ORAL_TABLET | Freq: Two times a day (BID) | ORAL | 0 refills | Status: DC | PRN

## 2017-10-22 NOTE — Telephone Encounter (Signed)
Have sent in buspar which can be taken as needed. Lexapro is not an as needed medication.

## 2017-10-22 NOTE — Telephone Encounter (Signed)
Called pt no answer and can't ;eave msg due to vm not set-up. Will send CRM just in case pt calls back . MD sent Buspar to [facility].Marland KitchenJohny Harrington

## 2017-12-09 ENCOUNTER — Other Ambulatory Visit: Payer: Self-pay

## 2017-12-20 ENCOUNTER — Other Ambulatory Visit: Payer: Self-pay | Admitting: Internal Medicine

## 2018-02-07 ENCOUNTER — Other Ambulatory Visit: Payer: Self-pay | Admitting: Internal Medicine

## 2018-06-02 ENCOUNTER — Telehealth: Payer: Self-pay

## 2018-06-02 DIAGNOSIS — R300 Dysuria: Secondary | ICD-10-CM

## 2018-06-02 NOTE — Telephone Encounter (Signed)
LVM for patient to call back to informed that orders have been placed to the lab for urinalysis and can come at her convenience

## 2018-06-02 NOTE — Telephone Encounter (Signed)
Copied from [place]. Topic: Appointment Scheduling - Scheduling Inquiry for Clinic >> Jun 02, 2018  1:16 PM Lennox Solders wrote: Reason for CRM: pt does not want and appt with dr Sharlet Salina. Pt just want to come in and give urine sample. Pt is having back pain and pain coming around to her left side. Pt has came in past for urine test only

## 2018-06-02 NOTE — Telephone Encounter (Signed)
Order placed for UA.

## 2018-06-02 NOTE — Addendum Note (Signed)
Addended by: Pricilla Holm A on: 06/02/2018 04:30 PM   Modules accepted: Orders

## 2018-06-03 ENCOUNTER — Other Ambulatory Visit: Payer: Self-pay | Admitting: Internal Medicine

## 2018-06-03 ENCOUNTER — Other Ambulatory Visit (INDEPENDENT_AMBULATORY_CARE_PROVIDER_SITE_OTHER): Payer: PRIVATE HEALTH INSURANCE

## 2018-06-03 ENCOUNTER — Other Ambulatory Visit: Payer: PRIVATE HEALTH INSURANCE

## 2018-06-03 DIAGNOSIS — R300 Dysuria: Secondary | ICD-10-CM

## 2018-06-03 LAB — URINALYSIS, ROUTINE W REFLEX MICROSCOPIC
BILIRUBIN URINE: NEGATIVE
KETONES UR: NEGATIVE
LEUKOCYTES UA: NEGATIVE
NITRITE: NEGATIVE
RBC / HPF: NONE SEEN (ref 0–?)
Specific Gravity, Urine: 1.01 (ref 1.000–1.030)
Total Protein, Urine: NEGATIVE
Urine Glucose: NEGATIVE
Urobilinogen, UA: 0.2 (ref 0.0–1.0)
pH: 7 (ref 5.0–8.0)

## 2018-06-05 ENCOUNTER — Other Ambulatory Visit: Payer: Self-pay | Admitting: Internal Medicine

## 2018-06-05 LAB — URINE CULTURE
MICRO NUMBER: 91113848
MICRO NUMBER:: 91113846
SPECIMEN QUALITY: ADEQUATE
SPECIMEN QUALITY:: ADEQUATE

## 2018-06-05 MED ORDER — SULFAMETHOXAZOLE-TRIMETHOPRIM 800-160 MG PO TABS: 1.0000 | ORAL_TABLET | Freq: Two times a day (BID) | ORAL | 0 refills | Status: DC

## 2018-06-05 MED ORDER — CIPROFLOXACIN HCL 500 MG PO TABS: 500.0000 mg | ORAL_TABLET | Freq: Two times a day (BID) | ORAL | 0 refills | Status: DC

## 2018-06-20 ENCOUNTER — Telehealth: Payer: Self-pay

## 2018-06-20 MED ORDER — NITROFURANTOIN MONOHYD MACRO 100 MG PO CAPS: 100.0000 mg | ORAL_CAPSULE | Freq: Two times a day (BID) | ORAL | 0 refills | Status: AC

## 2018-06-20 NOTE — Telephone Encounter (Signed)
Patient calling and states that she is having the pain in her back on her left side. States that is how it started before.

## 2018-06-20 NOTE — Telephone Encounter (Signed)
Sent in macrobid to take 1 pill twice a day for 1 week. If no resolution should have visit.

## 2018-06-20 NOTE — Telephone Encounter (Signed)
LVM for patient to call back to let us know what symptoms she is experiencing

## 2018-06-20 NOTE — Telephone Encounter (Signed)
Copied from [place] [id number]. Topic: General - Other >> Jun 20, 2018 11:41 AM Yvette Rack wrote: Reason for CRM: Pt states she feels she may need another round of the antibiotic because a few days after completing the antibiotic it came back. Pt requests call back. Cb# [phone] ext [phone] >> Jun 20, 2018  2:39 PM Morey Hummingbird wrote: Can patient give another UA?

## 2018-06-20 NOTE — Addendum Note (Signed)
Addended by: Pricilla Holm A on: 06/20/2018 03:54 PM   Modules accepted: Orders

## 2018-06-20 NOTE — Telephone Encounter (Signed)
LVM informing patient.

## 2018-07-30 ENCOUNTER — Other Ambulatory Visit: Payer: Self-pay | Admitting: Internal Medicine

## 2018-08-07 LAB — HM PAP SMEAR

## 2018-09-23 ENCOUNTER — Encounter: Payer: PRIVATE HEALTH INSURANCE | Admitting: Internal Medicine

## 2018-09-25 ENCOUNTER — Ambulatory Visit (INDEPENDENT_AMBULATORY_CARE_PROVIDER_SITE_OTHER): Payer: PRIVATE HEALTH INSURANCE | Admitting: Internal Medicine

## 2018-09-25 ENCOUNTER — Other Ambulatory Visit (INDEPENDENT_AMBULATORY_CARE_PROVIDER_SITE_OTHER): Payer: PRIVATE HEALTH INSURANCE

## 2018-09-25 ENCOUNTER — Encounter: Payer: Self-pay | Admitting: Internal Medicine

## 2018-09-25 VITALS — BP 122/80 | HR 78 | Temp 98.6°F | Ht 66.0 in | Wt 151.0 lb

## 2018-09-25 DIAGNOSIS — E785 Hyperlipidemia, unspecified: Secondary | ICD-10-CM | POA: Diagnosis not present

## 2018-09-25 DIAGNOSIS — Z0000 Encounter for general adult medical examination without abnormal findings: Secondary | ICD-10-CM

## 2018-09-25 DIAGNOSIS — F17200 Nicotine dependence, unspecified, uncomplicated: Secondary | ICD-10-CM

## 2018-09-25 DIAGNOSIS — F4323 Adjustment disorder with mixed anxiety and depressed mood: Secondary | ICD-10-CM

## 2018-09-25 LAB — COMPREHENSIVE METABOLIC PANEL
ALBUMIN: 4.3 g/dL (ref 3.5–5.2)
ALK PHOS: 51 U/L (ref 39–117)
ALT: 12 U/L (ref 0–35)
AST: 13 U/L (ref 0–37)
BUN: 12 mg/dL (ref 6–23)
CO2: 28 mEq/L (ref 19–32)
CREATININE: 0.72 mg/dL (ref 0.40–1.20)
Calcium: 9.1 mg/dL (ref 8.4–10.5)
Chloride: 105 mEq/L (ref 96–112)
GFR: 88.05 mL/min (ref >60.00)
Glucose, Bld: 94 mg/dL (ref 70–99)
POTASSIUM: 4.1 meq/L (ref 3.5–5.1)
SODIUM: 139 meq/L (ref 135–145)
TOTAL PROTEIN: 7 g/dL (ref 6.0–8.3)
Total Bilirubin: 0.6 mg/dL (ref 0.2–1.2)

## 2018-09-25 LAB — CBC
HEMATOCRIT: 42.2 % (ref 36.0–46.0)
Hemoglobin: 14.2 g/dL (ref 12.0–15.0)
MCHC: 33.5 g/dL (ref 30.0–36.0)
MCV: 90.1 fl (ref 78.0–100.0)
Platelets: 198 10*3/uL (ref 150.0–400.0)
RBC: 4.68 Mil/uL (ref 3.87–5.11)
RDW: 13.2 % (ref 11.5–15.5)
WBC: 6.8 10*3/uL (ref 4.0–10.5)

## 2018-09-25 LAB — LIPID PANEL
CHOLESTEROL: 166 mg/dL (ref 0–200)
HDL: 38.8 mg/dL — ABNORMAL LOW (ref >39.00)
LDL Cholesterol: 102 mg/dL — ABNORMAL HIGH (ref 0–99)
NONHDL: 127.11
Total CHOL/HDL Ratio: 4
Triglycerides: 127 mg/dL (ref 0.0–149.0)
VLDL: 25.4 mg/dL (ref 0.0–40.0)

## 2018-09-25 NOTE — Assessment & Plan Note (Signed)
Flu shot up to date. Shingrix counseled. Tetanus up to date. Colonoscopy up to date. Mammogram up to date, pap smear up to date with gyn. Counseled about sun safety and mole surveillance. Counseled about the dangers of distracted driving. Given 10 year screening recommendations.

## 2018-09-25 NOTE — Patient Instructions (Signed)
The EKG of the heart looks normal. Think about getting the shingles vaccine and call the office if you wish to get this.   Health Maintenance, Female Adopting a healthy lifestyle and getting preventive care can go a long way to promote health and wellness. Talk with your health care provider about what schedule of regular examinations is right for you. This is a good chance for you to check in with your provider about disease prevention and staying healthy. In between checkups, there are plenty of things you can do on your own. Experts have done a lot of research about which lifestyle changes and preventive measures are most likely to keep you healthy. Ask your health care provider for more information. Weight and diet Eat a healthy diet  Be sure to include plenty of vegetables, fruits, low-fat dairy products, and lean protein.  Do not eat a lot of foods high in solid fats, added sugars, or salt.  Get regular exercise. This is one of the most important things you can do for your health. ? Most adults should exercise for at least 150 minutes each week. The exercise should increase your heart rate and make you sweat (moderate-intensity exercise). ? Most adults should also do strengthening exercises at least twice a week. This is in addition to the moderate-intensity exercise. Maintain a healthy weight  Body mass index (BMI) is a measurement that can be used to identify possible weight problems. It estimates body fat based on height and weight. Your health care provider can help determine your BMI and help you achieve or maintain a healthy weight.  For females 60 years of age and older: ? A BMI below 18.5 is considered underweight. ? A BMI of 18.5 to 24.9 is normal. ? A BMI of 25 to 29.9 is considered overweight. ? A BMI of 30 and above is considered obese. Watch levels of cholesterol and blood lipids  You should start having your blood tested for lipids and cholesterol at 60 years of age,  then have this test every 5 years.  You may need to have your cholesterol levels checked more often if: ? Your lipid or cholesterol levels are high. ? You are older than 60 years of age. ? You are at high risk for heart disease. Cancer screening Lung Cancer  Lung cancer screening is recommended for adults 60-25 years old who are at high risk for lung cancer because of a history of smoking.  A yearly low-dose CT scan of the lungs is recommended for people who: ? Currently smoke. ? Have quit within the past 15 years. ? Have at least a 30-pack-year history of smoking. A pack year is smoking an average of one pack of cigarettes a day for 1 year.  Yearly screening should continue until it has been 15 years since you quit.  Yearly screening should stop if you develop a health problem that would prevent you from having lung cancer treatment. Breast Cancer  Practice breast self-awareness. This means understanding how your breasts normally appear and feel.  It also means doing regular breast self-exams. Let your health care provider know about any changes, no matter how small.  If you are in your 20s or 30s, you should have a clinical breast exam (CBE) by a health care provider every 1-3 years as part of a regular health exam.  If you are 60 or older, have a CBE every year. Also consider having a breast X-ray (mammogram) every year.  If you have a  family history of breast cancer, talk to your health care provider about genetic screening.  If you are at high risk for breast cancer, talk to your health care provider about having an MRI and a mammogram every year.  Breast cancer gene (BRCA) assessment is recommended for women who have family members with BRCA-related cancers. BRCA-related cancers include: ? Breast. ? Ovarian. ? Tubal. ? Peritoneal cancers.  Results of the assessment will determine the need for genetic counseling and BRCA1 and BRCA2 testing. Cervical Cancer Your health  care provider may recommend that you be screened regularly for cancer of the pelvic organs (ovaries, uterus, and vagina). This screening involves a pelvic examination, including checking for microscopic changes to the surface of your cervix (Pap test). You may be encouraged to have this screening done every 3 years, beginning at age 60.  For women ages 60-65, health care providers may recommend pelvic exams and Pap testing every 3 years, or they may recommend the Pap and pelvic exam, combined with testing for human papilloma virus (HPV), every 5 years. Some types of HPV increase your risk of cervical cancer. Testing for HPV may also be done on women of any age with unclear Pap test results.  Other health care providers may not recommend any screening for nonpregnant women who are considered low risk for pelvic cancer and who do not have symptoms. Ask your health care provider if a screening pelvic exam is right for you.  If you have had past treatment for cervical cancer or a condition that could lead to cancer, you need Pap tests and screening for cancer for at least 20 years after your treatment. If Pap tests have been discontinued, your risk factors (such as having a new sexual partner) need to be reassessed to determine if screening should resume. Some women have medical problems that increase the chance of getting cervical cancer. In these cases, your health care provider may recommend more frequent screening and Pap tests. Colorectal Cancer  This type of cancer can be detected and often prevented.  Routine colorectal cancer screening usually begins at 60 years of age and continues through 60 years of age.  Your health care provider may recommend screening at an earlier age if you have risk factors for colon cancer.  Your health care provider may also recommend using home test kits to check for hidden blood in the stool.  A small camera at the end of a tube can be used to examine your colon  directly (sigmoidoscopy or colonoscopy). This is done to check for the earliest forms of colorectal cancer.  Routine screening usually begins at age 60.  Direct examination of the colon should be repeated every 5-10 years through 60 years of age. However, you may need to be screened more often if early forms of precancerous polyps or small growths are found. Skin Cancer  Check your skin from head to toe regularly.  Tell your health care provider about any new moles or changes in moles, especially if there is a change in a mole's shape or color.  Also tell your health care provider if you have a mole that is larger than the size of a pencil eraser.  Always use sunscreen. Apply sunscreen liberally and repeatedly throughout the day.  Protect yourself by wearing long sleeves, pants, a wide-brimmed hat, and sunglasses whenever you are outside. Heart disease, diabetes, and high blood pressure  High blood pressure causes heart disease and increases the risk of stroke. High blood pressure  is more likely to develop in: ? People who have blood pressure in the high end of the normal range (130-139/85-89 mm Hg). ? People who are overweight or obese. ? People who are African American.  If you are 36-54 years of age, have your blood pressure checked every 3-5 years. If you are 50 years of age or older, have your blood pressure checked every year. You should have your blood pressure measured twice-once when you are at a hospital or clinic, and once when you are not at a hospital or clinic. Record the average of the two measurements. To check your blood pressure when you are not at a hospital or clinic, you can use: ? An automated blood pressure machine at a pharmacy. ? A home blood pressure monitor.  If you are between 30 years and 62 years old, ask your health care provider if you should take aspirin to prevent strokes.  Have regular diabetes screenings. This involves taking a blood sample to check  your fasting blood sugar level. ? If you are at a normal weight and have a low risk for diabetes, have this test once every three years after 60 years of age. ? If you are overweight and have a high risk for diabetes, consider being tested at a younger age or more often. Preventing infection Hepatitis B  If you have a higher risk for hepatitis B, you should be screened for this virus. You are considered at high risk for hepatitis B if: ? You were born in a country where hepatitis B is common. Ask your health care provider which countries are considered high risk. ? Your parents were born in a high-risk country, and you have not been immunized against hepatitis B (hepatitis B vaccine). ? You have HIV or AIDS. ? You use needles to inject street drugs. ? You live with someone who has hepatitis B. ? You have had sex with someone who has hepatitis B. ? You get hemodialysis treatment. ? You take certain medicines for conditions, including cancer, organ transplantation, and autoimmune conditions. Hepatitis C  Blood testing is recommended for: ? Everyone born from [birth year] through 1967. ? Anyone with known risk factors for hepatitis C. Sexually transmitted infections (STIs)  You should be screened for sexually transmitted infections (STIs) including gonorrhea and chlamydia if: ? You are sexually active and are younger than 60 years of age. ? You are older than 60 years of age and your health care provider tells you that you are at risk for this type of infection. ? Your sexual activity has changed since you were last screened and you are at an increased risk for chlamydia or gonorrhea. Ask your health care provider if you are at risk.  If you do not have HIV, but are at risk, it may be recommended that you take a prescription medicine daily to prevent HIV infection. This is called pre-exposure prophylaxis (PrEP). You are considered at risk if: ? You are sexually active and do not regularly use  condoms or know the HIV status of your partner(s). ? You take drugs by injection. ? You are sexually active with a partner who has HIV. Talk with your health care provider about whether you are at high risk of being infected with HIV. If you choose to begin PrEP, you should first be tested for HIV. You should then be tested every 3 months for as long as you are taking PrEP. Pregnancy  If you are premenopausal and you may become  pregnant, ask your health care provider about preconception counseling.  If you may become pregnant, take 400 to 800 micrograms (mcg) of folic acid every day.  If you want to prevent pregnancy, talk to your health care provider about birth control (contraception). Osteoporosis and menopause  Osteoporosis is a disease in which the bones lose minerals and strength with aging. This can result in serious bone fractures. Your risk for osteoporosis can be identified using a bone density scan.  If you are 48 years of age or older, or if you are at risk for osteoporosis and fractures, ask your health care provider if you should be screened.  Ask your health care provider whether you should take a calcium or vitamin D supplement to lower your risk for osteoporosis.  Menopause may have certain physical symptoms and risks.  Hormone replacement therapy may reduce some of these symptoms and risks. Talk to your health care provider about whether hormone replacement therapy is right for you. Follow these instructions at home:  Schedule regular health, dental, and eye exams.  Stay current with your immunizations.  Do not use any tobacco products including cigarettes, chewing tobacco, or electronic cigarettes.  If you are pregnant, do not drink alcohol.  If you are breastfeeding, limit how much and how often you drink alcohol.  Limit alcohol intake to no more than 1 drink per day for nonpregnant women. One drink equals 12 ounces of beer, 5 ounces of wine, or 1 ounces of  hard liquor.  Do not use street drugs.  Do not share needles.  Ask your health care provider for help if you need support or information about quitting drugs.  Tell your health care provider if you often feel depressed.  Tell your health care provider if you have ever been abused or do not feel safe at home. This information is not intended to replace advice given to you by your health care provider. Make sure you discuss any questions you have with your health care provider. Document Released: 03/19/2011 Document Revised: 02/09/2016 Document Reviewed: 06/07/2015 Elsevier Interactive Patient Education  2019 [employer].

## 2018-09-25 NOTE — Assessment & Plan Note (Signed)
Time spent counseling about tobacco usage: 3 minutes. I have asked about smoking and is smoking same as usual. The patient is advised to quit. The patient is not willing to quit. They would like to try to quit in the next 12 months. We will follow up with them in 12 months.  

## 2018-09-25 NOTE — Assessment & Plan Note (Signed)
Checking lipid panel and adjust simvastatin 20 mg daily.  °

## 2018-09-25 NOTE — Progress Notes (Signed)
   Subjective:   Patient ID: Sheila Harrington, female    DOB: [birth year], 58 y.o.   MRN: [id number]  HPI The patient is a 58 YO female coming in for physical.  PMH, [place], social history reviewed and updated  Review of Systems  Constitutional: Negative.   HENT: Negative.   Eyes: Negative.   Respiratory: Negative for cough, chest tightness and shortness of breath.   Cardiovascular: Negative for chest pain, palpitations and leg swelling.  Gastrointestinal: Negative for abdominal distention, abdominal pain, constipation, diarrhea, nausea and vomiting.  Musculoskeletal: Negative.   Skin: Negative.   Neurological: Negative.   Psychiatric/Behavioral: Negative.     Objective:  Physical Exam Constitutional:      Appearance: She is well-developed.  HENT:     Head: Normocephalic and atraumatic.  Neck:     Musculoskeletal: Normal range of motion.  Cardiovascular:     Rate and Rhythm: Normal rate and regular rhythm.  Pulmonary:     Effort: Pulmonary effort is normal. No respiratory distress.     Breath sounds: Normal breath sounds. No wheezing or rales.  Abdominal:     General: Bowel sounds are normal. There is no distension.     Palpations: Abdomen is soft.     Tenderness: There is no abdominal tenderness. There is no rebound.  Skin:    General: Skin is warm and dry.  Neurological:     Mental Status: She is alert and oriented to person, place, and time.     Coordination: Coordination normal.     Vitals:   09/25/18 0827  BP: 126/90  Pulse: 66  Temp: 97.9 F (36.6 C)  TempSrc: Oral  SpO2: 98%  Weight: 151 lb (68.5 kg)  Height: 5\' 6"  (1.676 m)   EKG: Rate 72, axis normal, intervals normal, sinus, RSR in V1, no other st or t wave changes, no prior to compare  Assessment & Plan:

## 2018-09-25 NOTE — Assessment & Plan Note (Signed)
Taking paxil with good control.

## 2018-10-13 ENCOUNTER — Other Ambulatory Visit: Payer: Self-pay

## 2018-11-14 ENCOUNTER — Encounter: Payer: Self-pay | Admitting: Internal Medicine

## 2018-11-14 ENCOUNTER — Other Ambulatory Visit: Payer: Self-pay

## 2018-11-28 ENCOUNTER — Other Ambulatory Visit: Payer: Self-pay | Admitting: Internal Medicine

## 2018-11-28 MED ORDER — PAROXETINE HCL 10 MG PO TABS: 10.0000 mg | ORAL_TABLET | Freq: Every day | ORAL | 0 refills | Status: DC

## 2018-11-28 MED ORDER — SIMVASTATIN 20 MG PO TABS: ORAL_TABLET | ORAL | 1 refills | Status: DC

## 2018-11-28 NOTE — Telephone Encounter (Signed)
Requested medication (s) are due for refill today: yes  Requested medication (s) are on the active medication list: yes  Last refill:  Last refilled by historical provider  Future visit scheduled: yes  Notes to clinic:  Last refilled by historical provider    Requested Prescriptions  Pending Prescriptions Disp Refills   PARoxetine (PAXIL) 10 MG tablet 90 tablet 0    Sig: Take 1 tablet (10 mg total) by mouth daily.     Psychiatry:  Antidepressants - SSRI Passed - 11/28/2018 12:58 PM      Passed - Valid encounter within last 6 months    Recent Outpatient Visits          2 months ago Routine health maintenance   [place], Elizabeth A, MD   1 year ago Routine general medical examination at a health care facility   [place], Elizabeth A, MD   1 year ago Left lower quadrant pain   [place], Elizabeth A, MD   1 year ago Adjustment disorder with mixed anxiety and depressed mood   [place], Elizabeth A, MD   2 years ago Routine general medical examination at a health care facility   [place], [place], MD      Future Appointments            In 10 months Hoyt Koch, MD [place], [place]         Signed Prescriptions Disp Refills   simvastatin (ZOCOR) 20 MG tablet 90 tablet 1    Sig: TAKE (1) TABLET BY MOUTH DAILY AT 6 PM.     Cardiovascular:  Antilipid - Statins Failed - 11/28/2018 12:58 PM      Failed - LDL in normal range and within 360 days    LDLC SERPL CALC-MCNC  Date Value Ref Range Status  07/15/2013 88 <100 mg/dL Final    Comment:    LDL-C is inaccurate if patient is nonfasting.                           Optimal               <  100                           Above optimal     100 -  129                           Borderline        130 -  159                           High              160 -  189                           Very high             >  189   LDL Cholesterol  Date Value Ref Range Status  09/25/2018 102 (H) 0 - 99 mg/dL Final         Failed - HDL in normal range and within 360 days  HDL Cholesterol by NMR  Date Value Ref Range Status  07/15/2013 41 >=40 mg/dL Final   HDL  Date Value Ref Range Status  09/25/2018 38.80 (L) >39.00 mg/dL Final         Passed - Total Cholesterol in normal range and within 360 days    Cholesterol  Date Value Ref Range Status  09/25/2018 166 0 - 200 mg/dL Final    Comment:    ATP III Classification       Desirable:  < 200 mg/dL               Borderline High:  200 - 239 mg/dL          High:  > = 240 mg/dL         Passed - Triglycerides in normal range and within 360 days    Triglycerides  Date Value Ref Range Status  09/25/2018 127.0 0.0 - 149.0 mg/dL Final    Comment:    Normal:  <150 mg/dLBorderline High:  150 - 199 mg/dL   Triglycerides by NMR  Date Value Ref Range Status  07/15/2013 136 <150 mg/dL Final         Passed - Patient is not pregnant      Passed - Valid encounter within last 12 months    Recent Outpatient Visits          2 months ago Routine health maintenance   [place], Elizabeth A, MD   1 year ago Routine general medical examination at a health care facility   [place], [place], MD   1 year ago Left lower quadrant pain   [place], Elizabeth A, MD   1 year ago Adjustment disorder with mixed anxiety and depressed mood   [place], Elizabeth A, MD   2 years ago Routine general medical examination at a health care facility   [place], [place], MD      Future Appointments            In 10 months Sharlet Salina Real Cons, MD [place], [facility]

## 2018-11-28 NOTE — Telephone Encounter (Signed)
Copied from [place] [id number]. Topic: Quick Communication - Rx Refill/Question >> Nov 28, 2018 12:49 PM Burchel, Abbi R wrote: Medication: simvastatin (ZOCOR) 20 MG tablet, PARoxetine (PAXIL) 10 MG tablet   *Pt requesting 90 say supply*   Preferred Pharmacy: [facility] [place] [address] [place] [address] Phone: [phone] Fax: [phone]    Pt was advised that RX refills may take up to 3 business days. We ask that you follow-up with your pharmacy.

## 2019-03-06 ENCOUNTER — Encounter: Payer: Self-pay | Admitting: Internal Medicine

## 2019-03-06 ENCOUNTER — Other Ambulatory Visit: Payer: Self-pay

## 2019-03-06 ENCOUNTER — Ambulatory Visit (INDEPENDENT_AMBULATORY_CARE_PROVIDER_SITE_OTHER): Payer: PRIVATE HEALTH INSURANCE | Admitting: Internal Medicine

## 2019-03-06 DIAGNOSIS — J349 Unspecified disorder of nose and nasal sinuses: Secondary | ICD-10-CM | POA: Diagnosis not present

## 2019-03-06 MED ORDER — HYDROCODONE-HOMATROPINE 5-1.5 MG/5ML PO SYRP: 5.0000 mL | ORAL_SOLUTION | Freq: Two times a day (BID) | ORAL | 0 refills | Status: DC | PRN

## 2019-03-06 MED ORDER — AMOXICILLIN-POT CLAVULANATE 875-125 MG PO TABS: 1.0000 | ORAL_TABLET | Freq: Two times a day (BID) | ORAL | 0 refills | Status: DC

## 2019-03-06 NOTE — Progress Notes (Signed)
Virtual Visit via Video Note  I connected with Sheila Harrington on 03/06/19 at  9:40 AM EDT by a video enabled telemedicine application and verified that I am speaking with the correct person using two identifiers.  The patient and the provider were at separate locations throughout the entire encounter.   I discussed the limitations of evaluation and management by telemedicine and the availability of in person appointments. The patient expressed understanding and agreed to proceed.  History of Present Illness: The patient is a 58 y.o. female with visit for 2 weeks of sinus drainage and cough. Started 2 weeks or more ago. She is taking otc medications for sinus without much relief. She does take robitussin about every 4 hours and it helps some temporarily. As it is wearing off she gets very persistent coughing fits. She gets rib pain with coughing now due to the frequency. She is a smoker and is still smoking. Coughing up green sputum. Denies SOB or chest tightness. Denies fevers or chills. Denies headaches or body aches. Has no known sick contacts. Overall it is not improving. Has tried many otc products  Observations/Objective: Appearance: normal, breathing appears normal, no coughing during visit, casual grooming, abdomen does not appear distended, throat with drainage, mental status is A and O times 3  Assessment and Plan: See problem oriented charting  Follow Up Instructions: rx augmentin and hycodan, if no improvement needs further evaluation  I discussed the assessment and treatment plan with the patient. The patient was provided an opportunity to ask questions and all were answered. The patient agreed with the plan and demonstrated an understanding of the instructions.   The patient was advised to call back or seek an in-person evaluation if the symptoms worsen or if the condition fails to improve as anticipated.  Hoyt Koch, MD

## 2019-03-06 NOTE — Assessment & Plan Note (Signed)
Most likely sinusitis. Rx for augmentin and hycodan. Advised to stop smoking. [place] narcotic database reviewed and no fills within 2 years. If no improvement on treatment should have testing for covid-19.

## 2019-03-16 ENCOUNTER — Other Ambulatory Visit: Payer: PRIVATE HEALTH INSURANCE

## 2019-03-16 ENCOUNTER — Other Ambulatory Visit: Payer: Self-pay

## 2019-03-16 ENCOUNTER — Telehealth: Payer: Self-pay

## 2019-03-16 DIAGNOSIS — Z20822 Contact with and (suspected) exposure to covid-19: Secondary | ICD-10-CM

## 2019-03-16 MED ORDER — BENZONATATE 200 MG PO CAPS: 200.0000 mg | ORAL_CAPSULE | Freq: Three times a day (TID) | ORAL | 0 refills | Status: DC | PRN

## 2019-03-16 MED ORDER — FLUTICASONE PROPIONATE 50 MCG/ACT NA SUSP: 2.0000 | Freq: Every day | NASAL | 6 refills | Status: DC

## 2019-03-16 NOTE — Telephone Encounter (Signed)
Scheduled patient for COVID 19 test today at [facility] at 12:45 pm.  Testing protocol reviewed.

## 2019-03-16 NOTE — Addendum Note (Signed)
Addended by: Denyce Robert on: 03/16/2019 11:59 AM   Modules accepted: Orders

## 2019-03-16 NOTE — Addendum Note (Signed)
Addended by: Pricilla Holm A on: 03/16/2019 02:20 PM   Modules accepted: Orders

## 2019-03-16 NOTE — Progress Notes (Signed)
[place]

## 2019-03-16 NOTE — Telephone Encounter (Signed)
Dr. Sharlet Salina would like patient to get tested. Thank you

## 2019-03-16 NOTE — Telephone Encounter (Signed)
Patient called back to express she does want some medication while she is going to get tested and waiting for covid results.  Patient states she is coughing up green mucus and the taste is nasty and does not want to wait to get meds for 5 days.  Patient call back   [place], [place] [phone] (Phone) [phone] (Fax)

## 2019-03-16 NOTE — Telephone Encounter (Signed)
Copied from [place] [id number]. Topic: Quick Communication - See Telephone Encounter >> Mar 16, 2019  8:33 AM Loma [place] wrote: CRM for notification. See Telephone encounter for: 03/16/19. Pt of [place] seen 6/19, still cough , heart rate up a little,and now green flem and her back is hurting. Talked to front desk and they advised since she was seen  to send note for new med since this med did not work.

## 2019-03-16 NOTE — Telephone Encounter (Signed)
Patient informed of MD response and stated understanding  

## 2019-03-16 NOTE — Telephone Encounter (Signed)
Have sent in tessalon perles and flonase to use for the drainage/cough. She has already taken an antibiotic course and I would not do another until further testing returns.

## 2019-03-16 NOTE — Telephone Encounter (Signed)
Needs testing for covid-19 please send to pool. She needs to quarantine if not doing so already.

## 2019-03-18 ENCOUNTER — Other Ambulatory Visit: Payer: Self-pay

## 2019-03-18 ENCOUNTER — Telehealth: Payer: Self-pay | Admitting: Internal Medicine

## 2019-03-18 ENCOUNTER — Encounter: Payer: Self-pay | Admitting: Internal Medicine

## 2019-03-18 ENCOUNTER — Ambulatory Visit (INDEPENDENT_AMBULATORY_CARE_PROVIDER_SITE_OTHER): Payer: PRIVATE HEALTH INSURANCE | Admitting: Internal Medicine

## 2019-03-18 DIAGNOSIS — R079 Chest pain, unspecified: Secondary | ICD-10-CM | POA: Diagnosis not present

## 2019-03-18 DIAGNOSIS — J069 Acute upper respiratory infection, unspecified: Secondary | ICD-10-CM

## 2019-03-18 MED ORDER — HYDROCODONE-HOMATROPINE 5-1.5 MG/5ML PO SYRP: 5.0000 mL | ORAL_SOLUTION | Freq: Four times a day (QID) | ORAL | 0 refills | Status: DC | PRN

## 2019-03-18 MED ORDER — ALIGN 4 MG PO CAPS: 1.0000 | ORAL_CAPSULE | Freq: Every day | ORAL | 0 refills | Status: DC

## 2019-03-18 MED ORDER — LEVOFLOXACIN 500 MG PO TABS: 500.0000 mg | ORAL_TABLET | Freq: Every day | ORAL | 0 refills | Status: AC

## 2019-03-18 NOTE — Progress Notes (Signed)
Virtual Visit via Video Note  I connected with Sheila Harrington on 03/18/19 at 11:20 AM EDT by a video enabled telemedicine application and verified that I am speaking with the correct person using two identifiers.   I discussed the limitations of evaluation and management by telemedicine and the availability of in person appointments. The patient expressed understanding and agreed to proceed.  History of Present Illness: We need to follow-up on her productive cough illness all 3 weeks duration.  She is complaining of cough productive for green/brown sputum.  She finished 10 days of Augmentin on Sunday.  Her test for COVID-19 is in the process of being done.  She denies fever.  She has developed right-sided chest pain worse with cough and movement.  It is been going on for past 2 to 3 days.  She took Tylenol with some relief.  She continues to have cough.  Patient is refusing to go to ER for chest x-ray due to fears of co-pay.  There has been no runny nose,shortness of breath, abdominal pain, diarrhea, constipation, arthralgias, skin rashes.   Observations/Objective: The patient appears to be in no acute distress, looks well.  Assessment and Plan:  See my Assessment and Plan. Follow Up Instructions:    I discussed the assessment and treatment plan with the patient. The patient was provided an opportunity to ask questions and all were answered. The patient agreed with the plan and demonstrated an understanding of the instructions.   The patient was advised to call back or seek an in-person evaluation if the symptoms worsen or if the condition fails to improve as anticipated.  I provided face-to-face time during this encounter. We were at different locations.   Walker Kehr, MD

## 2019-03-18 NOTE — Assessment & Plan Note (Signed)
Obtain chest x-ray on Monday if not better Treat URI Ibuprofen as needed Hycodan should help Follow-up with Dr. Sharlet Salina

## 2019-03-18 NOTE — Telephone Encounter (Signed)
Wanted to schedule a doxy with anyone to discuss scheduled with Dr. Alain Marion

## 2019-03-18 NOTE — Assessment & Plan Note (Signed)
Refractory.  She has not improved much on Augmentin.  Will give a round of Levaquin.  Probiotic.  Hycodan.  Chest x-ray on Monday not better.  COVID-19 test is in progress

## 2019-03-18 NOTE — Telephone Encounter (Signed)
Pt was taking amoxicillin-clavulanate (AUGMENTIN) 875-125 MG tablet  For her sinus infection and was getting a little better and then she went and had a covid test as advised and is awaiting results but now is complaining of pain in her side/rib area and still coughing up stuff and wanted to see if Dr. Sharlet Salina can give her a stronger anti biotic/ please advise

## 2019-03-19 LAB — NOVEL CORONAVIRUS, NAA: SARS-CoV-2, NAA: NOT DETECTED

## 2019-03-26 ENCOUNTER — Encounter: Payer: Self-pay | Admitting: Internal Medicine

## 2019-04-30 ENCOUNTER — Telehealth: Payer: Self-pay

## 2019-04-30 NOTE — Telephone Encounter (Signed)
Pt is requesting to transfer from [place] to [facility].   Please advise.

## 2019-04-30 NOTE — Telephone Encounter (Signed)
Fine with me

## 2019-05-05 NOTE — Telephone Encounter (Signed)
Left pt vm to call back to schedule appt

## 2019-05-05 NOTE — Telephone Encounter (Signed)
Ok Thx 

## 2019-05-05 NOTE — Telephone Encounter (Signed)
Can you let pt know that both providers are agreeable to the transfer?

## 2019-05-26 ENCOUNTER — Encounter: Payer: Self-pay | Admitting: Internal Medicine

## 2019-05-26 ENCOUNTER — Ambulatory Visit (INDEPENDENT_AMBULATORY_CARE_PROVIDER_SITE_OTHER): Payer: PRIVATE HEALTH INSURANCE | Admitting: Internal Medicine

## 2019-05-26 VITALS — BP 132/82 | HR 78 | Temp 97.7°F | Ht 66.0 in | Wt 147.0 lb

## 2019-05-26 DIAGNOSIS — Z0000 Encounter for general adult medical examination without abnormal findings: Secondary | ICD-10-CM

## 2019-05-26 DIAGNOSIS — F17200 Nicotine dependence, unspecified, uncomplicated: Secondary | ICD-10-CM

## 2019-05-26 DIAGNOSIS — E785 Hyperlipidemia, unspecified: Secondary | ICD-10-CM | POA: Diagnosis not present

## 2019-05-26 DIAGNOSIS — F4323 Adjustment disorder with mixed anxiety and depressed mood: Secondary | ICD-10-CM | POA: Diagnosis not present

## 2019-05-26 MED ORDER — SIMVASTATIN 20 MG PO TABS: ORAL_TABLET | ORAL | 3 refills | Status: DC

## 2019-05-26 MED ORDER — ALPRAZOLAM 0.25 MG PO TABS: 0.2500 mg | ORAL_TABLET | Freq: Two times a day (BID) | ORAL | 1 refills | Status: DC | PRN

## 2019-05-26 MED ORDER — VITAMIN D3 50 MCG (2000 UT) PO CAPS: 2000.0000 [IU] | ORAL_CAPSULE | Freq: Every day | ORAL | 3 refills | Status: DC

## 2019-05-26 NOTE — Progress Notes (Signed)
Subjective:  Patient ID: Sheila Harrington, female    DOB: September 23, 1960  Age: 58 y.o. MRN: [id number]  CC: No chief complaint on file.   HPI LANEYA KRIESEL presents for dyslipidemia, anxiety - rare; tobacco   Outpatient Medications Prior to Visit  Medication Sig Dispense Refill  . Ascorbic Acid (VITAMIN C PO) Take by mouth.    . busPIRone (BUSPAR) 5 MG tablet Take 1 tablet (5 mg total) by mouth 2 (two) times daily as needed. 30 tablet 0  . Cholecalciferol (VITAMIN D3 PO) Take by mouth.    Marland Kitchen PARoxetine (PAXIL) 10 MG tablet Take 1 tablet (10 mg total) by mouth daily. 90 tablet 0  . simvastatin (ZOCOR) 20 MG tablet TAKE (1) TABLET BY MOUTH DAILY AT 6 PM. 90 tablet 1  . benzonatate (TESSALON) 200 MG capsule Take 1 capsule (200 mg total) by mouth 3 (three) times daily as needed. (Patient not taking: Reported on 05/26/2019) 60 capsule 0  . estradiol (ESTRACE) 0.1 MG/GM vaginal cream PLACE 1 APPLICATORFUL VAGINALLY AT BEDTIME. (Patient not taking: Reported on 05/26/2019) 42.5 g 0  . fluticasone (FLONASE) 50 MCG/ACT nasal spray Place 2 sprays into both nostrils daily. (Patient not taking: Reported on [id number]) 16 g 6  . folic acid (FOLVITE) 1 MG tablet Take 1 mg by mouth daily.    Marland Kitchen HYDROcodone-homatropine (HYCODAN) 5-1.5 MG/5ML syrup Take 5 mLs by mouth every 6 (six) hours as needed for cough. (Patient not taking: Reported on 05/26/2019) 120 mL 0  . Probiotic Product (ALIGN) 4 MG CAPS Take 1 capsule (4 mg total) by mouth daily. (Patient not taking: Reported on 05/26/2019) 30 capsule 0   No facility-administered medications prior to visit.     ROS: Review of Systems  Constitutional: Negative for activity change, appetite change, chills, fatigue and unexpected weight change.  HENT: Negative for congestion, mouth sores and sinus pressure.   Eyes: Negative for visual disturbance.  Respiratory: Negative for cough and chest tightness.   Gastrointestinal: Negative for abdominal pain and nausea.   Genitourinary: Negative for difficulty urinating, frequency and vaginal pain.  Musculoskeletal: Negative for back pain and gait problem.  Skin: Negative for pallor and rash.  Neurological: Negative for dizziness, tremors, weakness, numbness and headaches.  Psychiatric/Behavioral: Negative for confusion, sleep disturbance and suicidal ideas. The patient is nervous/anxious.     Objective:  BP 132/82 (BP Location: Right Arm, Patient Position: Sitting, Cuff Size: Normal)   Pulse 78   Temp 97.7 F (36.5 C) (Oral)   Ht 5\' 6"  (1.676 m)   Wt 147 lb (66.7 kg)   SpO2 98%   BMI 23.73 kg/m   BP Readings from Last 3 Encounters:  05/26/19 132/82  09/25/18 122/80  09/20/17 138/80    Wt Readings from Last 3 Encounters:  05/26/19 147 lb (66.7 kg)  09/25/18 151 lb (68.5 kg)  09/20/17 153 lb (69.4 kg)    Physical Exam Constitutional:      General: She is not in acute distress.    Appearance: She is well-developed.  HENT:     Head: Normocephalic.     Right Ear: External ear normal.     Left Ear: External ear normal.     Nose: Nose normal.  Eyes:     General:        Right eye: No discharge.        Left eye: No discharge.     Conjunctiva/sclera: Conjunctivae normal.     Pupils: Pupils are equal, round,  and reactive to light.  Neck:     Musculoskeletal: Normal range of motion and neck supple.     Thyroid: No thyromegaly.     Vascular: No JVD.     Trachea: No tracheal deviation.  Cardiovascular:     Rate and Rhythm: Normal rate and regular rhythm.     Heart sounds: Normal heart sounds.  Pulmonary:     Effort: No respiratory distress.     Breath sounds: No stridor. No wheezing.  Abdominal:     General: Bowel sounds are normal. There is no distension.     Palpations: Abdomen is soft. There is no mass.     Tenderness: There is no abdominal tenderness. There is no guarding or rebound.  Musculoskeletal:        General: No tenderness.  Lymphadenopathy:     Cervical: No cervical  adenopathy.  Skin:    Findings: No erythema or rash.  Neurological:     Cranial Nerves: No cranial nerve deficit.     Motor: No abnormal muscle tone.     Coordination: Coordination normal.     Deep Tendon Reflexes: Reflexes normal.  Psychiatric:        Behavior: Behavior normal.        Thought Content: Thought content normal.        Judgment: Judgment normal.     Lab Results  Component Value Date   WBC 6.8 09/25/2018   HGB 14.2 09/25/2018   HCT 42.2 09/25/2018   PLT 198.0 09/25/2018   GLUCOSE 94 09/25/2018   CHOL 166 09/25/2018   TRIG 127.0 09/25/2018   HDL 38.80 (L) 09/25/2018   LDLDIRECT 177.3 02/01/2012   LDLCALC 102 (H) 09/25/2018   ALT 12 09/25/2018   AST 13 09/25/2018   NA 139 09/25/2018   K 4.1 09/25/2018   CL 105 09/25/2018   CREATININE 0.72 09/25/2018   BUN 12 09/25/2018   CO2 28 09/25/2018   TSH 3.020 07/15/2013    Dg Chest 2 View  Result Date: 10/12/2013 CLINICAL DATA:  Physical exam, smoker EXAM: CHEST  2 VIEW COMPARISON:  None FINDINGS: Normal heart size, mediastinal contours, and pulmonary vascularity. Lungs clear. No pleural effusion or pneumothorax. Bones unremarkable. IMPRESSION: No acute abnormalities. Electronically Signed   By: Lavonia Dana M.D.   On: 10/12/2013 14:52    Assessment & Plan:   There are no diagnoses linked to this encounter.   No orders of the defined types were placed in this encounter.    Follow-up: No follow-ups on file.  Walker Kehr, MD

## 2019-05-26 NOTE — Assessment & Plan Note (Signed)
Cardiac CT score test offered On Zocor

## 2019-05-26 NOTE — Patient Instructions (Signed)
Cardiac CT calcium scoring test $150   Computed tomography, more commonly known as a CT or CAT scan, is a diagnostic medical imaging test. Like traditional x-rays, it produces multiple images or pictures of the inside of the body. The cross-sectional images generated during a CT scan can be reformatted in multiple planes. They can even generate three-dimensional images. These images can be viewed on a computer monitor, printed on film or by a 3D printer, or transferred to a CD or DVD. CT images of internal organs, bones, soft tissue and blood vessels provide greater detail than traditional x-rays, particularly of soft tissues and blood vessels. A cardiac CT scan for coronary calcium is a non-invasive way of obtaining information about the presence, location and extent of calcified plaque in the coronary arteries-the vessels that supply oxygen-containing blood to the heart muscle. Calcified plaque results when there is a build-up of fat and other substances under the inner layer of the artery. This material can calcify which signals the presence of atherosclerosis, a disease of the vessel wall, also called coronary artery disease (CAD). People with this disease have an increased risk for heart attacks. In addition, over time, progression of plaque build up (CAD) can narrow the arteries or even close off blood flow to the heart. The result may be chest pain, sometimes called "angina," or a heart attack. Because calcium is a marker of CAD, the amount of calcium detected on a cardiac CT scan is a helpful prognostic tool. The findings on cardiac CT are expressed as a calcium score. Another name for this test is coronary artery calcium scoring.  What are some common uses of the procedure? The goal of cardiac CT scan for calcium scoring is to determine if CAD is present and to what extent, even if there are no symptoms. It is a screening study that may be recommended by a physician for patients with risk factors  for CAD but no clinical symptoms. The major risk factors for CAD are: . high blood cholesterol levels  . family history of heart attacks  . diabetes  . high blood pressure  . cigarette smoking  . overweight or obese  . physical inactivity   A negative cardiac CT scan for calcium scoring shows no calcification within the coronary arteries. This suggests that CAD is absent or so minimal it cannot be seen by this technique. The chance of having a heart attack over the next two to five years is very low under these circumstances. A positive test means that CAD is present, regardless of whether or not the patient is experiencing any symptoms. The amount of calcification-expressed as the calcium score-may help to predict the likelihood of a myocardial infarction (heart attack) in the coming years and helps your medical doctor or cardiologist decide whether the patient may need to take preventive medicine or undertake other measures such as diet and exercise to lower the risk for heart attack. The extent of CAD is graded according to your calcium score:  Calcium Score  Presence of CAD (coronary artery disease)  0 No evidence of CAD   1-10 Minimal evidence of CAD  11-100 Mild evidence of CAD  101-400 Moderate evidence of CAD  Over 400 Extensive evidence of CAD      These suggestions will probably help you to improve your metabolism if you are not overweight and to lose weight if you are overweight: 1.  Reduce your consumption of sugars and starches.  Eliminate high fructose corn syrup from  diet.  Reduce your consumption of processed foods.  For desserts try to have seasonal fruits, berries, nuts, cheeses or dark chocolate with more than 70% cacao. 2.  Do not snack 3.  You do not have to eat breakfast.  If you choose to have breakfast-eat plain greek yogurt, eggs, oatmeal (without sugar) 4.  Drink water, freshly brewed unsweetened tea (green, black or herbal) or coffee.  Do not drink sodas  including diet sodas , juices, beverages sweetened with artificial sweeteners. 5.  Reduce your consumption of refined grains. 6.  Avoid protein drinks such as Optifast, Slim fast etc. Eat chicken, fish, meat, dairy and beans for your sources of protein 7.  Natural unprocessed fats like cold pressed virgin olive oil, butter, coconut oil are good for you.  Eat avocados 8.  Increase your consumption of fiber.  Fruits, berries, vegetables, whole grains, flaxseeds, Chia seeds, beans, popcorn, nuts, oatmeal are good sources of fiber 9.  Use vinegar in your diet, i.e. apple cider vinegar, red wine or balsamic vinegar 10.  You can try fasting.  For example you can skip breakfast and lunch every other day (24-hour fast) 11.  Stress reduction, good night sleep, relaxation, meditation, yoga and other physical activity is likely to help you to maintain low weight too. 12.  If you drink alcohol, limit your alcohol intake to no more than 2 drinks a day.   Mediterranean diet is good for you. ([employer] has a typical Mediterranean cuisine menu) The Mediterranean diet is a way of eating based on the traditional cuisine of countries bordering the [place] Sea. While there is no single definition of the Mediterranean diet, it is typically high in vegetables, fruits, whole grains, beans, nut and seeds, and olive oil. The main components of Mediterranean diet include: . Daily consumption of vegetables, fruits, whole grains and healthy fats  . Weekly intake of fish, poultry, beans and eggs  . Moderate portions of dairy products  . Limited intake of red meat Other important elements of the Mediterranean diet are sharing meals with family and friends, enjoying a glass of red wine and being physically active. Health benefits of a Mediterranean diet: A traditional Mediterranean diet consisting of large quantities of fresh fruits and vegetables, nuts, fish and olive oil-coupled with physical activity-can reduce  your risk of serious mental and physical health problems by: Preventing heart disease and strokes. Following a Mediterranean diet limits your intake of refined breads, processed foods, and red meat, and encourages drinking red wine instead of hard liquor-all factors that can help prevent heart disease and stroke. Keeping you agile. If you're an older adult, the nutrients gained with a Mediterranean diet may reduce your risk of developing muscle weakness and other signs of frailty by about 70 percent. Reducing the risk of Alzheimer's. Research suggests that the Mediterranean diet may improve cholesterol, blood sugar levels, and overall blood vessel health, which in turn may reduce your risk of Alzheimer's disease or dementia. Halving the risk of Parkinson's disease. The high levels of antioxidants in the Mediterranean diet can prevent cells from undergoing a damaging process called oxidative stress, thereby cutting the risk of Parkinson's disease in half. Increasing longevity. By reducing your risk of developing heart disease or cancer with the Mediterranean diet, you're reducing your risk of death at any age by 20%. Protecting against type 2 diabetes. A Mediterranean diet is rich in fiber which digests slowly, prevents huge swings in blood sugar, and can help you maintain a healthy   weight.    Cabbage soup recipe that will not make you gain weight: Take 1 small head of cabbage, 1 average pack of celery, 4 green peppers, 4 onions, 2 cans diced tomatoes (they are not available without salt), salt and spices to taste.  Chop cabbage, celery, peppers and onions.  And tomatoes and 2-2.5 liters (2.5 quarts) of water so that it would just cover the vegetables.  Bring to boil.  Add spices and salt.  Turn heat to low/medium and simmer for 20-25 minutes.  Naturally, you can make a smaller batch and change some of the ingredients.  

## 2019-05-26 NOTE — Assessment & Plan Note (Signed)
Discussed.

## 2019-05-26 NOTE — Assessment & Plan Note (Signed)
Xanax - rare use  Potential benefits of a long term benzodiazepines  use as well as potential risks  and complications were explained to the patient and were aknowledged. 

## 2019-09-30 ENCOUNTER — Encounter: Payer: PRIVATE HEALTH INSURANCE | Admitting: Internal Medicine

## 2019-10-07 ENCOUNTER — Ambulatory Visit (INDEPENDENT_AMBULATORY_CARE_PROVIDER_SITE_OTHER): Payer: PRIVATE HEALTH INSURANCE | Admitting: Internal Medicine

## 2019-10-07 ENCOUNTER — Encounter: Payer: Self-pay | Admitting: Internal Medicine

## 2019-10-07 ENCOUNTER — Other Ambulatory Visit: Payer: Self-pay

## 2019-10-07 DIAGNOSIS — R1032 Left lower quadrant pain: Secondary | ICD-10-CM | POA: Diagnosis not present

## 2019-10-07 DIAGNOSIS — K5792 Diverticulitis of intestine, part unspecified, without perforation or abscess without bleeding: Secondary | ICD-10-CM

## 2019-10-07 MED ORDER — AMOXICILLIN-POT CLAVULANATE 875-125 MG PO TABS: 1.0000 | ORAL_TABLET | Freq: Two times a day (BID) | ORAL | 0 refills | Status: DC

## 2019-10-07 MED ORDER — METRONIDAZOLE 500 MG PO TABS: 500.0000 mg | ORAL_TABLET | Freq: Three times a day (TID) | ORAL | 0 refills | Status: DC

## 2019-10-07 MED ORDER — TRAMADOL HCL 50 MG PO TABS: 50.0000 mg | ORAL_TABLET | Freq: Four times a day (QID) | ORAL | 0 refills | Status: AC | PRN

## 2019-10-07 NOTE — Patient Instructions (Signed)
Diverticulitis  Diverticulitis is infection or inflammation of small pouches (diverticula) in the colon that form due to a condition called diverticulosis. Diverticula can trap stool (feces) and bacteria, causing infection and inflammation. Diverticulitis may cause severe stomach pain and diarrhea. It may lead to tissue damage in the colon that causes bleeding. The diverticula may also burst (rupture) and cause infected stool to enter other areas of the abdomen. Complications of diverticulitis can include:  Bleeding.  Severe infection.  Severe pain.  Rupture (perforation) of the colon.  Blockage (obstruction) of the colon. What are the causes? This condition is caused by stool becoming trapped in the diverticula, which allows bacteria to grow in the diverticula. This leads to inflammation and infection. What increases the risk? You are more likely to develop this condition if:  You have diverticulosis. The risk for diverticulosis increases if: ? You are overweight or obese. ? You use tobacco products. ? You do not get enough exercise.  You eat a diet that does not include enough fiber. High-fiber foods include fruits, vegetables, beans, nuts, and whole grains. What are the signs or symptoms? Symptoms of this condition may include:  Pain and tenderness in the abdomen. The pain is normally located on the left side of the abdomen, but it may occur in other areas.  Fever and chills.  Bloating.  Cramping.  Nausea.  Vomiting.  Changes in bowel routines.  Blood in your stool. How is this diagnosed? This condition is diagnosed based on:  Your medical history.  A physical exam.  Tests to make sure there is nothing else causing your condition. These tests may include: ? Blood tests. ? Urine tests. ? Imaging tests of the abdomen, including X-rays, ultrasounds, MRIs, or CT scans. How is this treated? Most cases of this condition are mild and can be treated at home.  Treatment may include:  Taking over-the-counter pain medicines.  Following a clear liquid diet.  Taking antibiotic medicines by mouth.  Rest. More severe cases may need to be treated at a hospital. Treatment may include:  Not eating or drinking.  Taking prescription pain medicine.  Receiving antibiotic medicines through an IV tube.  Receiving fluids and nutrition through an IV tube.  Surgery. When your condition is under control, your health care provider may recommend that you have a colonoscopy. This is an exam to look at the entire large intestine. During the exam, a lubricated, bendable tube is inserted into the anus and then passed into the rectum, colon, and other parts of the large intestine. A colonoscopy can show how severe your diverticula are and whether something else may be causing your symptoms. Follow these instructions at home: Medicines  Take over-the-counter and prescription medicines only as told by your health care provider. These include fiber supplements, probiotics, and stool softeners.  If you were prescribed an antibiotic medicine, take it as told by your health care provider. Do not stop taking the antibiotic even if you start to feel better.  Do not drive or use heavy machinery while taking prescription pain medicine. General instructions   Follow a full liquid diet or another diet as directed by your health care provider. After your symptoms improve, your health care provider may tell you to change your diet. He or she may recommend that you eat a diet that contains at least 25 g (25 grams) of fiber daily. Fiber makes it easier to pass stool. Healthy sources of fiber include: ? Berries. One cup contains 4-8 grams of   fiber. ? Beans or lentils. One half cup contains 5-8 grams of fiber. ? Green vegetables. One cup contains 4 grams of fiber.  Exercise for at least 30 minutes, 3 times each week. You should exercise hard enough to raise your heart rate and  break a sweat.  Keep all follow-up visits as told by your health care provider. This is important. You may need a colonoscopy. Contact a health care provider if:  Your pain does not improve.  You have a hard time drinking or eating food.  Your bowel movements do not return to normal. Get help right away if:  Your pain gets worse.  Your symptoms do not get better with treatment.  Your symptoms suddenly get worse.  You have a fever.  You vomit more than one time.  You have stools that are bloody, black, or tarry. Summary  Diverticulitis is infection or inflammation of small pouches (diverticula) in the colon that form due to a condition called diverticulosis. Diverticula can trap stool (feces) and bacteria, causing infection and inflammation.  You are at higher risk for this condition if you have diverticulosis and you eat a diet that does not include enough fiber.  Most cases of this condition are mild and can be treated at home. More severe cases may need to be treated at a hospital.  When your condition is under control, your health care provider may recommend that you have an exam called a colonoscopy. This exam can show how severe your diverticula are and whether something else may be causing your symptoms. This information is not intended to replace advice given to you by your health care provider. Make sure you discuss any questions you have with your health care provider. Document Revised: 08/16/2017 Document Reviewed: 10/06/2016 [employer] Patient Education  2020 [employer]  

## 2019-10-07 NOTE — Progress Notes (Signed)
Subjective:  Patient ID: Sheila Harrington, female    DOB: April 04, 1961  Age: 59 y.o. MRN: [id number]  CC: No chief complaint on file.   HPI Sheila Harrington presents for LLQ abd pain since 1/16 - severe pain 10/10 On Bactrim - last dose today; abx given for UTI 50% better, however, far from feeling well  Outpatient Medications Prior to Visit  Medication Sig Dispense Refill  . ALPRAZolam (XANAX) 0.25 MG tablet Take 1 tablet (0.25 mg total) by mouth 2 (two) times daily as needed for anxiety. 30 tablet 1  . Ascorbic Acid (VITAMIN C PO) Take by mouth.    . Cholecalciferol (VITAMIN D3) 50 MCG (2000 UT) capsule Take 1 capsule (2,000 Units total) by mouth daily. 100 capsule 3  . simvastatin (ZOCOR) 20 MG tablet TAKE (1) TABLET BY MOUTH DAILY AT 6 PM. 90 tablet 3   No facility-administered medications prior to visit.    ROS: Review of Systems  Constitutional: Negative for activity change, appetite change, chills, fatigue and unexpected weight change.  HENT: Negative for congestion, mouth sores and sinus pressure.   Eyes: Negative for visual disturbance.  Respiratory: Negative for cough and chest tightness.   Gastrointestinal: Positive for abdominal pain. Negative for nausea.  Genitourinary: Negative for difficulty urinating, frequency and vaginal pain.  Musculoskeletal: Negative for back pain and gait problem.  Skin: Negative for pallor and rash.  Neurological: Negative for dizziness, tremors, weakness, numbness and headaches.  Psychiatric/Behavioral: Negative for confusion and sleep disturbance.    Objective:  BP 132/74 (BP Location: Left Arm, Patient Position: Sitting, Cuff Size: Normal)   Pulse 86   Temp 98.1 F (36.7 C) (Oral)   Ht 5\' 6"  (1.676 m)   Wt 148 lb (67.1 kg)   SpO2 96%   BMI 23.89 kg/m   BP Readings from Last 3 Encounters:  10/07/19 132/74  05/26/19 132/82  09/25/18 122/80    Wt Readings from Last 3 Encounters:  10/07/19 148 lb (67.1 kg)  05/26/19 147  lb (66.7 kg)  09/25/18 151 lb (68.5 kg)    Physical Exam Constitutional:      General: She is not in acute distress.    Appearance: She is well-developed.  HENT:     Head: Normocephalic.     Right Ear: External ear normal.     Left Ear: External ear normal.     Nose: Nose normal.  Eyes:     General:        Right eye: No discharge.        Left eye: No discharge.     Conjunctiva/sclera: Conjunctivae normal.     Pupils: Pupils are equal, round, and reactive to light.  Neck:     Thyroid: No thyromegaly.     Vascular: No JVD.     Trachea: No tracheal deviation.  Cardiovascular:     Rate and Rhythm: Normal rate and regular rhythm.     Heart sounds: Normal heart sounds.  Pulmonary:     Effort: No respiratory distress.     Breath sounds: No stridor. No wheezing.  Abdominal:     General: Bowel sounds are normal. There is no distension.     Palpations: Abdomen is soft. There is no mass.     Tenderness: There is no abdominal tenderness. There is no guarding or rebound.  Musculoskeletal:        General: Tenderness present.     Cervical back: Normal range of motion and neck supple.  Lymphadenopathy:  Cervical: No cervical adenopathy.  Skin:    Findings: No erythema or rash.  Neurological:     Cranial Nerves: No cranial nerve deficit.     Motor: No abnormal muscle tone.     Coordination: Coordination normal.     Deep Tendon Reflexes: Reflexes normal.  Psychiatric:        Behavior: Behavior normal.        Thought Content: Thought content normal.        Judgment: Judgment normal.   Left lower quadrant is painful.  No rebound.  No mass  Lab Results  Component Value Date   WBC 6.8 09/25/2018   HGB 14.2 09/25/2018   HCT 42.2 09/25/2018   PLT 198.0 09/25/2018   GLUCOSE 94 09/25/2018   CHOL 166 09/25/2018   TRIG 127.0 09/25/2018   HDL 38.80 (L) 09/25/2018   LDLDIRECT 177.3 02/01/2012   LDLCALC 102 (H) 09/25/2018   ALT 12 09/25/2018   AST 13 09/25/2018   NA 139  09/25/2018   K 4.1 09/25/2018   CL 105 09/25/2018   CREATININE 0.72 09/25/2018   BUN 12 09/25/2018   CO2 28 09/25/2018   TSH 3.020 07/15/2013    DG Chest 2 View  Result Date: 10/12/2013 CLINICAL DATA:  Physical exam, smoker EXAM: CHEST  2 VIEW COMPARISON:  None FINDINGS: Normal heart size, mediastinal contours, and pulmonary vascularity. Lungs clear. No pleural effusion or pneumothorax. Bones unremarkable. IMPRESSION: No acute abnormalities. Electronically Signed   By: Lavonia Dana M.D.   On: 10/12/2013 14:52    Assessment & Plan:   There are no diagnoses linked to this encounter.   No orders of the defined types were placed in this encounter.    Follow-up: No follow-ups on file.  Walker Kehr, MD

## 2019-10-07 NOTE — Assessment & Plan Note (Addendum)
CT if not well in 10 d Augmentin and Flagyl p.o. Tramadol as needed pain Diet discussed

## 2019-10-11 ENCOUNTER — Encounter: Payer: Self-pay | Admitting: Internal Medicine

## 2019-10-11 DIAGNOSIS — R1032 Left lower quadrant pain: Secondary | ICD-10-CM | POA: Insufficient documentation

## 2019-10-11 NOTE — Assessment & Plan Note (Addendum)
likely due to diverticulitis versus other Oral antibiotics. Tramadol as needed pain

## 2019-10-26 ENCOUNTER — Telehealth: Payer: Self-pay | Admitting: Internal Medicine

## 2019-10-26 ENCOUNTER — Other Ambulatory Visit: Payer: Self-pay | Admitting: Internal Medicine

## 2019-10-26 NOTE — Telephone Encounter (Signed)
    Patient calling for advice, requesting medication for UTI be extended. Patient states she started feeling better since 10/07/19 visit, but started to have symptoms again once she took an Ibuprofen after getting her Covid vaccine.  Advised patient an appointment / urine specimen would be needed.  Patient requesting to speak with CMA first.

## 2019-10-27 MED ORDER — METRONIDAZOLE 500 MG PO TABS: 500.0000 mg | ORAL_TABLET | Freq: Three times a day (TID) | ORAL | 0 refills | Status: DC

## 2019-10-27 MED ORDER — AMOXICILLIN-POT CLAVULANATE 875-125 MG PO TABS: 1.0000 | ORAL_TABLET | Freq: Two times a day (BID) | ORAL | 0 refills | Status: DC

## 2019-10-27 NOTE — Telephone Encounter (Signed)
Meds reordered.  Office visit with me if symptoms relax again.  Thanks

## 2019-10-27 NOTE — Telephone Encounter (Signed)
Please advise about sending in a refill on the antibiotic or would you like pt to give urine sample?

## 2019-10-29 NOTE — Telephone Encounter (Signed)
My-chart message sent to patient in follow-up.

## 2019-11-23 ENCOUNTER — Other Ambulatory Visit: Payer: Self-pay

## 2019-11-23 DIAGNOSIS — R928 Other abnormal and inconclusive findings on diagnostic imaging of breast: Secondary | ICD-10-CM

## 2019-11-25 ENCOUNTER — Encounter: Payer: PRIVATE HEALTH INSURANCE | Admitting: Internal Medicine

## 2019-12-02 ENCOUNTER — Other Ambulatory Visit: Payer: Self-pay | Admitting: Internal Medicine

## 2019-12-03 ENCOUNTER — Ambulatory Visit (INDEPENDENT_AMBULATORY_CARE_PROVIDER_SITE_OTHER): Payer: PRIVATE HEALTH INSURANCE | Admitting: Internal Medicine

## 2019-12-03 ENCOUNTER — Other Ambulatory Visit: Payer: Self-pay

## 2019-12-03 ENCOUNTER — Encounter: Payer: Self-pay | Admitting: Internal Medicine

## 2019-12-03 VITALS — BP 132/78 | HR 76 | Temp 98.2°F | Ht 66.0 in | Wt 148.0 lb

## 2019-12-03 DIAGNOSIS — K5792 Diverticulitis of intestine, part unspecified, without perforation or abscess without bleeding: Secondary | ICD-10-CM

## 2019-12-03 DIAGNOSIS — Z0000 Encounter for general adult medical examination without abnormal findings: Secondary | ICD-10-CM

## 2019-12-03 DIAGNOSIS — E785 Hyperlipidemia, unspecified: Secondary | ICD-10-CM

## 2019-12-03 DIAGNOSIS — Z23 Encounter for immunization: Secondary | ICD-10-CM

## 2019-12-03 DIAGNOSIS — F4323 Adjustment disorder with mixed anxiety and depressed mood: Secondary | ICD-10-CM

## 2019-12-03 LAB — URINALYSIS, ROUTINE W REFLEX MICROSCOPIC
Bilirubin Urine: NEGATIVE
Hgb urine dipstick: NEGATIVE
Ketones, ur: NEGATIVE
Nitrite: NEGATIVE
Specific Gravity, Urine: 1.02 (ref 1.000–1.030)
Total Protein, Urine: NEGATIVE
Urine Glucose: NEGATIVE
Urobilinogen, UA: 0.2 (ref 0.0–1.0)
pH: 6 (ref 5.0–8.0)

## 2019-12-03 LAB — BASIC METABOLIC PANEL
BUN: 13 mg/dL (ref 6–23)
CO2: 28 mEq/L (ref 19–32)
Calcium: 9.7 mg/dL (ref 8.4–10.5)
Chloride: 103 mEq/L (ref 96–112)
Creatinine, Ser: 0.72 mg/dL (ref 0.40–1.20)
GFR: 82.51 mL/min (ref >60.00)
Glucose, Bld: 90 mg/dL (ref 70–99)
Potassium: 4.1 mEq/L (ref 3.5–5.1)
Sodium: 137 mEq/L (ref 135–145)

## 2019-12-03 LAB — HEPATIC FUNCTION PANEL
ALT: 20 U/L (ref 0–35)
AST: 20 U/L (ref 0–37)
Albumin: 4.4 g/dL (ref 3.5–5.2)
Alkaline Phosphatase: 54 U/L (ref 39–117)
Bilirubin, Direct: 0.1 mg/dL (ref 0.0–0.3)
Total Bilirubin: 0.7 mg/dL (ref 0.2–1.2)
Total Protein: 7.5 g/dL (ref 6.0–8.3)

## 2019-12-03 LAB — CBC WITH DIFFERENTIAL/PLATELET
Basophils Absolute: 0 10*3/uL (ref 0.0–0.1)
Basophils Relative: 0.5 % (ref 0.0–3.0)
Eosinophils Absolute: 0.1 10*3/uL (ref 0.0–0.7)
Eosinophils Relative: 2 % (ref 0.0–5.0)
HCT: 43.2 % (ref 36.0–46.0)
Hemoglobin: 14.5 g/dL (ref 12.0–15.0)
Lymphocytes Relative: 38.2 % (ref 12.0–46.0)
Lymphs Abs: 2.8 10*3/uL (ref 0.7–4.0)
MCHC: 33.5 g/dL (ref 30.0–36.0)
MCV: 90.9 fl (ref 78.0–100.0)
Monocytes Absolute: 0.4 10*3/uL (ref 0.1–1.0)
Monocytes Relative: 6 % (ref 3.0–12.0)
Neutro Abs: 3.8 10*3/uL (ref 1.4–7.7)
Neutrophils Relative %: 53.3 % (ref 43.0–77.0)
Platelets: 203 10*3/uL (ref 150.0–400.0)
RBC: 4.75 Mil/uL (ref 3.87–5.11)
RDW: 13.7 % (ref 11.5–15.5)
WBC: 7.2 10*3/uL (ref 4.0–10.5)

## 2019-12-03 LAB — LIPID PANEL
Cholesterol: 190 mg/dL (ref 0–200)
HDL: 40.1 mg/dL (ref >39.00)
LDL Cholesterol: 119 mg/dL — ABNORMAL HIGH (ref 0–99)
NonHDL: 149.94
Total CHOL/HDL Ratio: 5
Triglycerides: 154 mg/dL — ABNORMAL HIGH (ref 0.0–149.0)
VLDL: 30.8 mg/dL (ref 0.0–40.0)

## 2019-12-03 LAB — TSH: TSH: 3.78 u[IU]/mL (ref 0.35–4.50)

## 2019-12-03 MED ORDER — SIMVASTATIN 20 MG PO TABS: ORAL_TABLET | ORAL | 3 refills | Status: DC

## 2019-12-03 MED ORDER — ALPRAZOLAM 0.25 MG PO TABS: 0.2500 mg | ORAL_TABLET | Freq: Two times a day (BID) | ORAL | 1 refills | Status: DC | PRN

## 2019-12-03 NOTE — Assessment & Plan Note (Signed)
Xanax - rare use  Potential benefits of a long term benzodiazepines  use as well as potential risks  and complications were explained to the patient and were aknowledged. 

## 2019-12-03 NOTE — Assessment & Plan Note (Signed)
Cardiac CT score test offered

## 2019-12-03 NOTE — Assessment & Plan Note (Addendum)
  We discussed age appropriate health related issues, including available/recomended screening tests and vaccinations. Labs were ordered to be later reviewed . All questions were answered. We discussed one or more of the following - seat belt use, use of sunscreen/sun exposure exercise, safe sex, fall risk reduction, second hand smoke exposure, firearm use and storage, seat belt use, a need for adhering to healthy diet and exercise. Labs were ordered to be discussed when are available. All questions were answered.   Shingrix  Cardiac CT score test offered

## 2019-12-03 NOTE — Assessment & Plan Note (Signed)
Resolved on abx

## 2019-12-03 NOTE — Patient Instructions (Signed)

## 2019-12-03 NOTE — Progress Notes (Signed)
Subjective:  Patient ID: Sheila Harrington, female    DOB: May 07, 1961  Age: 59 y.o. MRN: [id number]  CC: No chief complaint on file.   HPI Sheila Harrington presents for a well exam F/u anxiety F/u diverticulitis, abd pain - resolved  Outpatient Medications Prior to Visit  Medication Sig Dispense Refill  . ALPRAZolam (XANAX) 0.25 MG tablet Take 1 tablet (0.25 mg total) by mouth 2 (two) times daily as needed for anxiety. 30 tablet 1  . Ascorbic Acid (VITAMIN C PO) Take by mouth.    . Cholecalciferol (VITAMIN D3) 50 MCG (2000 UT) capsule Take 1 capsule (2,000 Units total) by mouth daily. 100 capsule 3  . simvastatin (ZOCOR) 20 MG tablet TAKE (1) TABLET BY MOUTH DAILY AT 6 PM. 90 tablet 3  . amoxicillin-clavulanate (AUGMENTIN) 875-125 MG tablet Take 1 tablet by mouth 2 (two) times daily. 20 tablet 0  . metroNIDAZOLE (FLAGYL) 500 MG tablet Take 1 tablet (500 mg total) by mouth 3 (three) times daily. 21 tablet 0   No facility-administered medications prior to visit.    ROS: Review of Systems  Constitutional: Negative for activity change, appetite change, chills, fatigue and unexpected weight change.  HENT: Negative for congestion, mouth sores and sinus pressure.   Eyes: Negative for visual disturbance.  Respiratory: Negative for cough and chest tightness.   Gastrointestinal: Negative for abdominal pain and nausea.  Genitourinary: Negative for difficulty urinating, frequency and vaginal pain.  Musculoskeletal: Negative for back pain and gait problem.  Skin: Negative for pallor and rash.  Neurological: Negative for dizziness, tremors, weakness, numbness and headaches.  Psychiatric/Behavioral: Negative for confusion and sleep disturbance.    Objective:  BP 132/78 (BP Location: Right Arm, Patient Position: Sitting, Cuff Size: Normal)   Pulse 76   Temp 98.2 F (36.8 C) (Oral)   Ht 5\' 6"  (1.676 m)   Wt 148 lb (67.1 kg)   SpO2 (!) 88%   BMI 23.89 kg/m   BP Readings from Last 3  Encounters:  12/03/19 132/78  10/07/19 132/74  05/26/19 132/82    Wt Readings from Last 3 Encounters:  12/03/19 148 lb (67.1 kg)  10/07/19 148 lb (67.1 kg)  05/26/19 147 lb (66.7 kg)    Physical Exam Constitutional:      General: She is not in acute distress.    Appearance: She is well-developed.  HENT:     Head: Normocephalic.     Right Ear: External ear normal.     Left Ear: External ear normal.     Nose: Nose normal.  Eyes:     General:        Right eye: No discharge.        Left eye: No discharge.     Conjunctiva/sclera: Conjunctivae normal.     Pupils: Pupils are equal, round, and reactive to light.  Neck:     Thyroid: No thyromegaly.     Vascular: No JVD.     Trachea: No tracheal deviation.  Cardiovascular:     Rate and Rhythm: Normal rate and regular rhythm.     Heart sounds: Normal heart sounds.  Pulmonary:     Effort: No respiratory distress.     Breath sounds: No stridor. No wheezing.  Abdominal:     General: Bowel sounds are normal. There is no distension.     Palpations: Abdomen is soft. There is no mass.     Tenderness: There is no abdominal tenderness. There is no guarding or rebound.  Musculoskeletal:        General: No tenderness.     Cervical back: Normal range of motion and neck supple.  Lymphadenopathy:     Cervical: No cervical adenopathy.  Skin:    Findings: No erythema or rash.  Neurological:     Mental Status: She is oriented to person, place, and time.     Cranial Nerves: No cranial nerve deficit.     Motor: No abnormal muscle tone.     Coordination: Coordination normal.     Deep Tendon Reflexes: Reflexes normal.  Psychiatric:        Behavior: Behavior normal.        Thought Content: Thought content normal.        Judgment: Judgment normal.     Lab Results  Component Value Date   WBC 6.8 09/25/2018   HGB 14.2 09/25/2018   HCT 42.2 09/25/2018   PLT 198.0 09/25/2018   GLUCOSE 94 09/25/2018   CHOL 166 09/25/2018   TRIG 127.0  09/25/2018   HDL 38.80 (L) 09/25/2018   LDLDIRECT 177.3 02/01/2012   LDLCALC 102 (H) 09/25/2018   ALT 12 09/25/2018   AST 13 09/25/2018   NA 139 09/25/2018   K 4.1 09/25/2018   CL 105 09/25/2018   CREATININE 0.72 09/25/2018   BUN 12 09/25/2018   CO2 28 09/25/2018   TSH 3.020 07/15/2013    DG Chest 2 View  Result Date: 10/12/2013 CLINICAL DATA:  Physical exam, smoker EXAM: CHEST  2 VIEW COMPARISON:  None FINDINGS: Normal heart size, mediastinal contours, and pulmonary vascularity. Lungs clear. No pleural effusion or pneumothorax. Bones unremarkable. IMPRESSION: No acute abnormalities. Electronically Signed   By: Lavonia Dana M.D.   On: 10/12/2013 14:52    Assessment & Plan:     Walker Kehr, MD

## 2019-12-18 ENCOUNTER — Other Ambulatory Visit: Payer: Self-pay | Admitting: Internal Medicine

## 2019-12-18 DIAGNOSIS — E785 Hyperlipidemia, unspecified: Secondary | ICD-10-CM

## 2020-01-04 ENCOUNTER — Other Ambulatory Visit: Payer: Self-pay

## 2020-01-04 ENCOUNTER — Ambulatory Visit (INDEPENDENT_AMBULATORY_CARE_PROVIDER_SITE_OTHER)
Admission: RE | Admit: 2020-01-04 | Discharge: 2020-01-04 | Disposition: A | Discharge status: Home / Self Care | Payer: Self-pay | Source: Ambulatory Visit | Attending: Internal Medicine | Admitting: Internal Medicine

## 2020-01-04 DIAGNOSIS — E785 Hyperlipidemia, unspecified: Secondary | ICD-10-CM

## 2020-01-05 ENCOUNTER — Other Ambulatory Visit: Payer: Self-pay | Admitting: Internal Medicine

## 2020-01-05 DIAGNOSIS — E785 Hyperlipidemia, unspecified: Secondary | ICD-10-CM

## 2020-01-05 DIAGNOSIS — I2510 Atherosclerotic heart disease of native coronary artery without angina pectoris: Secondary | ICD-10-CM | POA: Insufficient documentation

## 2020-01-05 DIAGNOSIS — Z0000 Encounter for general adult medical examination without abnormal findings: Secondary | ICD-10-CM

## 2020-01-05 DIAGNOSIS — I2583 Coronary atherosclerosis due to lipid rich plaque: Secondary | ICD-10-CM

## 2020-01-05 MED ORDER — ASPIRIN EC 81 MG PO TBEC: 81.0000 mg | DELAYED_RELEASE_TABLET | Freq: Every day | ORAL | 3 refills | Status: AC

## 2020-01-13 ENCOUNTER — Telehealth: Payer: Self-pay | Admitting: Cardiology

## 2020-01-13 NOTE — Telephone Encounter (Signed)
Okay for spouse to accompany to appointment.

## 2020-01-13 NOTE — Telephone Encounter (Signed)
Patient is requesting to have her husband come with her to her appt on 01/26/20. She states that it is not a medical reason but because he does not believe she is telling him everything. Please advise.

## 2020-01-23 NOTE — Progress Notes (Signed)
Cardiology Office Note:    Date:  01/26/2020   ID:  Sheila Harrington, DOB May 15, 1961, MRN [id number]  PCP:  Cassandria Anger, MD  Cardiologist:  No primary care provider on file.  Electrophysiologist:  None   Referring MD: Cassandria Anger, MD   Chief Complaint  Patient presents with  . Coronary Artery Disease    History of Present Illness:    Sheila Harrington is a 59 y.o. female with a hx of hyperlipidemia, tobacco use, anxiety who is referred by Dr. Alain Marion for evaluation of coronary artery disease.  She underwent a calcium score on 01/04/2020, which was 471 (98th percentile).  She reports that since she was diagnosed with CAD she started exercising, walking 1 to 1.5 miles per day.  Denies any exertional chest pain.  Does report she has some dyspnea, particularly with walking up hills, but has improved since she started walking.  She denies any lightheadedness, syncope, LE edema, or palpitations.  She has smoked up to 1 pack/day x 30 years.  Currently down to 0.5 pack/day.  She denies any history of heart disease in her immediate family.   Past Medical History:  Diagnosis Date  . Anxiety   . Chronic UTI   . Hyperlipidemia   . Personal history of colonic adenoma 11/20/2012   11/2012 - 1 cm sigmoid adenoma  . Varicella     Past Surgical History:  Procedure Laterality Date  . COLONOSCOPY W/ POLYPECTOMY    . G2P2    . TUBAL LIGATION    . WISDOM TOOTH EXTRACTION      Current Medications: Current Meds  Medication Sig  . ALPRAZolam (XANAX) 0.25 MG tablet Take 1 tablet (0.25 mg total) by mouth 2 (two) times daily as needed for anxiety.  Marland Kitchen aspirin EC 81 MG tablet Take 1 tablet (81 mg total) by mouth daily.  . [DISCONTINUED] simvastatin (ZOCOR) 20 MG tablet TAKE (1) TABLET BY MOUTH DAILY AT 6 PM.     Allergies:   Patient has no known allergies.   Social History   Socioeconomic History  . Marital status: Married    Spouse name: Not on file  . Number of children:  2  . Years of education: 30  . Highest education level: Not on file  Occupational History  . Occupation: Insurance account manager  Tobacco Use  . Smoking status: Current Every Day Smoker    Packs/day: 0.50    Years: 10.00    Pack years: 5.00    Types: Cigarettes  . Smokeless tobacco: Never Used  . Tobacco comment: advised taht it is bad  Substance and Sexual Activity  . Alcohol use: No    Alcohol/week: 0.0 standard drinks  . Drug use: No  . Sexual activity: Yes    Partners: Male  Other Topics Concern  . Not on file  Social History Narrative   HSG, on the job training. Married - 1986 - 2 dtrs' 89, '93. 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 Marriage is in good health   Social Determinants of Health   Financial Resource Strain:   . Difficulty of Paying Living Expenses:   Food Insecurity:   . Worried About Charity fundraiser in the Last Year:   . Arboriculturist in the Last Year:   Transportation Needs:   . Film/video editor (Medical):   Marland Kitchen Lack of Transportation (Non-Medical):   Physical Activity:   . Days of Exercise per Week:   .  Minutes of Exercise per Session:   Stress:   . Feeling of Stress :   Social Connections:   . Frequency of Communication with Friends and Family:   . Frequency of Social Gatherings with Friends and Family:   . Attends Religious Services:   . Active Member of Clubs or Organizations:   . Attends Archivist Meetings:   Marland Kitchen Marital Status:      Family History: The patient's family history includes Cancer in her maternal grandmother and mother; Hyperlipidemia in her mother; Stroke in her mother. There is no history of Diabetes, Heart disease, or Colon cancer.  ROS:   Please see the history of present illness.     All other systems reviewed and are negative.  EKGs/Labs/Other Studies Reviewed:    The following studies were reviewed today:   EKG:  EKG is ordered today.  The ekg ordered today demonstrates normal sinus  rhythm, rate 89, poor R wave progression, Q waves in V3  Calcium score 01/04/20: Coronary calcium score of 471. This was 89 percentile for age and sex matched control.  IMPRESSION: 1.  Aortic Atherosclerosis (ICD10-I70.0).  Recent Labs: 12/03/2019: ALT 20; BUN 13; Creatinine, Ser 0.72; Hemoglobin 14.5; Platelets 203.0; Potassium 4.1; Sodium 137; TSH 3.78  Recent Lipid Panel    Component Value Date/Time   CHOL 190 12/03/2019 1155   TRIG 154.0 (H) 12/03/2019 1155   TRIG 136 07/15/2013 1616   HDL 40.10 12/03/2019 1155   HDL 41 07/15/2013 1616   CHOLHDL 5 12/03/2019 1155   VLDL 30.8 12/03/2019 1155   LDLCALC 119 (H) 12/03/2019 1155   LDLCALC 88 07/15/2013 1616   LDLDIRECT 177.3 02/01/2012 0909    Physical Exam:    VS:  BP (!) 144/86   Pulse 89   Ht 5\' 6"  (1.676 m)   Wt 148 lb 6.4 oz (67.3 kg)   SpO2 100%   BMI 23.95 kg/m     Wt Readings from Last 3 Encounters:  01/26/20 148 lb 6.4 oz (67.3 kg)  12/03/19 148 lb (67.1 kg)  10/07/19 148 lb (67.1 kg)     GEN:  Well nourished, well developed in no acute distress HEENT: Normal NECK: No JVD LYMPHATICS: No lymphadenopathy CARDIAC: RRR, no murmurs, rubs, gallops RESPIRATORY:  Clear to auscultation without rales, wheezing or rhonchi  ABDOMEN: Soft, non-tender, non-distended MUSCULOSKELETAL:  No edema; No deformity  SKIN: Warm and dry NEUROLOGIC:  Alert and oriented x 3 PSYCHIATRIC:  Normal affect   ASSESSMENT:    1. Coronary artery disease involving native coronary artery of native heart, angina presence unspecified   2. Nonspecific abnormal electrocardiogram (ECG) (EKG)   3. Shortness of breath   4. Hyperlipidemia, unspecified hyperlipidemia type   5. Essential hypertension    PLAN:    Coronary artery disease: calcium score on 01/04/2020 was 471 (98th percentile).  Denies any exertional chest pain, but does report some dyspnea, which could represent anginal equivalent -Currently on simvastatin 20 mg daily.  LDL 119 on  12/03/2019.  Will switch to atorvastatin 40 mg daily -Lexiscan Myoview  Abnormal EKG: Poor R wave progression with Q wave in V3, could represent old anterior infarct.  Will check echocardiogram  Hyperlipidemia: LDL 119 on 12/03/2019.  Switch from simvastatin to atorvastatin 40 mg daily as above  Tobacco use: Patient counseled on the risks of tobacco use and cessation strongly encouraged  RTC in 1 month   Medication Adjustments/Labs and Tests Ordered: Current medicines are reviewed at length with the  patient today.  Concerns regarding medicines are outlined above.  Orders Placed This Encounter  Procedures  . MYOCARDIAL PERFUSION IMAGING  . EKG 12-Lead  . ECHOCARDIOGRAM COMPLETE   Meds ordered this encounter  Medications  . atorvastatin (LIPITOR) 40 MG tablet    Sig: Take 1 tablet (40 mg total) by mouth daily.    Dispense:  90 tablet    Refill:  3    Patient Instructions  Medication Instructions:  STOP simvastatin START atorvastatin (Lipitor) 40 mg daily  *If you need a refill on your cardiac medications before your next appointment, please call your pharmacy*  Testing/Procedures: Your physician has requested that you have an echocardiogram. Echocardiography is a painless test that uses sound waves to create images of your heart. It provides your doctor with information about the size and shape of your heart and how well your heart's chambers and valves are working. This procedure takes approximately one hour. There are no restrictions for this procedure.  Your physician has requested that you have a lexiscan myoview. For further information please visit [email or web address]. Please follow instruction sheet, as given.  This will be done at our [facility] location:  [place]: At [facility], you and your health needs are our priority.  As part of our continuing mission to provide you with exceptional heart care, we have created designated  Provider Care Teams.  These Care Teams include your primary Cardiologist (physician) and Advanced Practice Providers (APPs -  Physician Assistants and Nurse Practitioners) who all work together to provide you with the care you need, when you need it.  We recommend signing up for the patient portal called "MyChart".  Sign up information is provided on this After Visit Summary.  MyChart is used to connect with patients for Virtual Visits (Telemedicine).  Patients are able to view lab/test results, encounter notes, upcoming appointments, etc.  Non-urgent messages can be sent to your provider as well.   To learn more about what you can do with MyChart, go to [email or web address].    Your next appointment:   1 month(s) (after testing completed)  The format for your next appointment:   In Person  Provider:   Oswaldo Milian, MD      Signed, Donato Heinz, MD  01/26/2020 9:21 AM    [place]

## 2020-01-26 ENCOUNTER — Ambulatory Visit (INDEPENDENT_AMBULATORY_CARE_PROVIDER_SITE_OTHER): Payer: PRIVATE HEALTH INSURANCE | Admitting: Cardiology

## 2020-01-26 ENCOUNTER — Other Ambulatory Visit: Payer: Self-pay

## 2020-01-26 ENCOUNTER — Encounter: Payer: Self-pay | Admitting: Cardiology

## 2020-01-26 VITALS — BP 144/86 | HR 89 | Ht 66.0 in | Wt 148.4 lb

## 2020-01-26 DIAGNOSIS — E785 Hyperlipidemia, unspecified: Secondary | ICD-10-CM | POA: Diagnosis not present

## 2020-01-26 DIAGNOSIS — I2510 Atherosclerotic heart disease of native coronary artery without angina pectoris: Secondary | ICD-10-CM

## 2020-01-26 DIAGNOSIS — R0602 Shortness of breath: Secondary | ICD-10-CM | POA: Diagnosis not present

## 2020-01-26 DIAGNOSIS — R9431 Abnormal electrocardiogram [ECG] [EKG]: Secondary | ICD-10-CM | POA: Diagnosis not present

## 2020-01-26 DIAGNOSIS — I10 Essential (primary) hypertension: Secondary | ICD-10-CM

## 2020-01-26 MED ORDER — ATORVASTATIN CALCIUM 40 MG PO TABS
40.0000 mg | ORAL_TABLET | Freq: Every day | ORAL | 3 refills | Status: DC
Start: 2020-01-26 — End: 2020-06-20

## 2020-01-26 NOTE — Patient Instructions (Signed)
Medication Instructions:  STOP simvastatin START atorvastatin (Lipitor) 40 mg daily  *If you need a refill on your cardiac medications before your next appointment, please call your pharmacy*  Testing/Procedures: Your physician has requested that you have an echocardiogram. Echocardiography is a painless test that uses sound waves to create images of your heart. It provides your doctor with information about the size and shape of your heart and how well your heart's chambers and valves are working. This procedure takes approximately one hour. There are no restrictions for this procedure.  Your physician has requested that you have a lexiscan myoview. For further information please visit [email or web address]. Please follow instruction sheet, as given.  This will be done at our [facility] location:  [place]: At [facility], you and your health needs are our priority.  As part of our continuing mission to provide you with exceptional heart care, we have created designated Provider Care Teams.  These Care Teams include your primary Cardiologist (physician) and Advanced Practice Providers (APPs -  Physician Assistants and Nurse Practitioners) who all work together to provide you with the care you need, when you need it.  We recommend signing up for the patient portal called "MyChart".  Sign up information is provided on this After Visit Summary.  MyChart is used to connect with patients for Virtual Visits (Telemedicine).  Patients are able to view lab/test results, encounter notes, upcoming appointments, etc.  Non-urgent messages can be sent to your provider as well.   To learn more about what you can do with MyChart, go to [email or web address].    Your next appointment:   1 month(s) (after testing completed)  The format for your next appointment:   In Person  Provider:   Oswaldo Milian, MD

## 2020-02-10 ENCOUNTER — Telehealth (HOSPITAL_COMMUNITY): Payer: Self-pay

## 2020-02-10 NOTE — Telephone Encounter (Signed)
Detailed instructions left on the patient's answering machine per DPR. Asked to call back with any questions. S.Williams EMTP 

## 2020-02-16 ENCOUNTER — Other Ambulatory Visit: Payer: Self-pay

## 2020-02-16 ENCOUNTER — Ambulatory Visit (HOSPITAL_BASED_OUTPATIENT_CLINIC_OR_DEPARTMENT_OTHER): Payer: PRIVATE HEALTH INSURANCE

## 2020-02-16 ENCOUNTER — Ambulatory Visit (HOSPITAL_COMMUNITY): Payer: PRIVATE HEALTH INSURANCE | Attending: Cardiovascular Disease

## 2020-02-16 DIAGNOSIS — R0602 Shortness of breath: Secondary | ICD-10-CM

## 2020-02-16 LAB — MYOCARDIAL PERFUSION IMAGING
LV dias vol: 54 mL (ref 46–106)
LV sys vol: 12 mL
Peak HR: 122 {beats}/min
Rest HR: 82 {beats}/min
SDS: 0
SRS: 2
SSS: 2
TID: 0.9

## 2020-02-16 LAB — ECHOCARDIOGRAM COMPLETE
Height: 66 in
Weight: 2368 oz

## 2020-02-16 MED ORDER — TECHNETIUM TC 99M TETROFOSMIN IV KIT
32.7000 | PACK | Freq: Once | INTRAVENOUS | Status: AC | PRN
  Administered 2020-02-16: 32.7 via INTRAVENOUS
  Filled 2020-02-16: qty 33

## 2020-02-16 MED ORDER — REGADENOSON 0.4 MG/5ML IV SOLN
0.4000 mg | Freq: Once | INTRAVENOUS | Status: AC
  Administered 2020-02-16: 0.4 mg via INTRAVENOUS

## 2020-02-16 MED ORDER — TECHNETIUM TC 99M TETROFOSMIN IV KIT
11.0000 | PACK | Freq: Once | INTRAVENOUS | Status: AC | PRN
  Administered 2020-02-16: 11 via INTRAVENOUS
  Filled 2020-02-16: qty 11

## 2020-02-24 ENCOUNTER — Telehealth: Payer: Self-pay | Admitting: Cardiology

## 2020-02-24 NOTE — Telephone Encounter (Signed)
Patient made aware husband is okay to attend appointment.

## 2020-02-24 NOTE — Telephone Encounter (Signed)
New Message  Patient is calling in to get husband approved to accompany patient to her appointment on 02/26/20 with Dr. Gardiner Rhyme. States that they will be going on tests and would like husband there for that. Please call and confirm.

## 2020-02-24 NOTE — Telephone Encounter (Signed)
Left message to call office

## 2020-02-25 NOTE — Progress Notes (Signed)
Cardiology Office Note:    Date:  02/26/2020   ID:  Sheila Harrington, DOB 1961-09-08, MRN [id number]  PCP:  Cassandria Anger, MD  Cardiologist:  No primary care provider on file.  Electrophysiologist:  None   Referring MD: Cassandria Anger, MD   No chief complaint on file.   History of Present Illness:    Sheila Harrington is a 59 y.o. female with a hx of hyperlipidemia, tobacco use, anxiety who presents for follow-up.  She was referred by Dr. Alain Marion for evaluation of coronary artery disease, initially seen on 01/26/2020.  She underwent a calcium score on 01/04/2020, which was 471 (98th percentile).  She reports that since she was diagnosed with CAD she started exercising, walking 1 to 1.5 miles per day.  Denies any exertional chest pain.  Does report she has some dyspnea, particularly with walking up hills, but has improved since she started walking.  She denies any lightheadedness, syncope, LE edema, or palpitations.  She has smoked up to 1 pack/day x 30 years.  Currently down to 0.5 pack/day.  She denies any history of heart disease in her immediate family.  TTE on 02/16/2020 showed LVEF 70 to 49%, grade 1 diastolic dysfunction, normal RV function, mild MR.  Lexiscan Myoview on 02/16/2020 showed normal perfusion.  Since last clinic visit, she reports she has been doing well.  She is walking for 20 to 25 minutes every day at lunch and then again for 30 minutes in the evening.  She denies any chest pain or dyspnea.  Denies lightheadedness, syncope, or palpitations.  She is down to 5 to 6 cigarettes/day.    Past Medical History:  Diagnosis Date  . Anxiety   . Chronic UTI   . Hyperlipidemia   . Personal history of colonic adenoma 11/20/2012   11/2012 - 1 cm sigmoid adenoma  . Varicella     Past Surgical History:  Procedure Laterality Date  . COLONOSCOPY W/ POLYPECTOMY    . G2P2    . TUBAL LIGATION    . WISDOM TOOTH EXTRACTION      Current Medications: Current Meds    Medication Sig  . ALPRAZolam (XANAX) 0.25 MG tablet Take 1 tablet (0.25 mg total) by mouth 2 (two) times daily as needed for anxiety.  Marland Kitchen aspirin EC 81 MG tablet Take 1 tablet (81 mg total) by mouth daily.  Marland Kitchen atorvastatin (LIPITOR) 40 MG tablet Take 1 tablet (40 mg total) by mouth daily.     Allergies:   Patient has no known allergies.   Social History   Socioeconomic History  . Marital status: Married    Spouse name: Not on file  . Number of children: 2  . Years of education: 36  . Highest education level: Not on file  Occupational History  . Occupation: Insurance account manager  Tobacco Use  . Smoking status: Current Every Day Smoker    Packs/day: 0.50    Years: 10.00    Pack years: 5.00    Types: Cigarettes  . Smokeless tobacco: Never Used  . Tobacco comment: advised taht it is bad  Substance and Sexual Activity  . Alcohol use: No    Alcohol/week: 0.0 standard drinks  . Drug use: No  . Sexual activity: Yes    Partners: Male  Other Topics Concern  . Not on file  Social History Narrative   HSG, on the job training. Married - 1986 - 2 dtrs' 89, '93. 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 Marriage is in good health   Social Determinants of Health   Financial Resource Strain:   . Difficulty of Paying Living Expenses:   Food Insecurity:   . Worried About Charity fundraiser in the Last Year:   . Arboriculturist in the Last Year:   Transportation Needs:   . Film/video editor (Medical):   Marland Kitchen Lack of Transportation (Non-Medical):   Physical Activity:   . Days of Exercise per Week:   . Minutes of Exercise per Session:   Stress:   . Feeling of Stress :   Social Connections:   . Frequency of Communication with Friends and Family:   . Frequency of Social Gatherings with Friends and Family:   . Attends Religious Services:   . Active Member of Clubs or Organizations:   . Attends Archivist Meetings:   Marland Kitchen Marital Status:      Family History: The  patient's family history includes Cancer in her maternal grandmother and mother; Hyperlipidemia in her mother; Stroke in her mother. There is no history of Diabetes, Heart disease, or Colon cancer.  ROS:   Please see the history of present illness.     All other systems reviewed and are negative.  EKGs/Labs/Other Studies Reviewed:    The following studies were reviewed today:   EKG:  EKG is not ordered today.  The ekg ordered most recently demonstrates normal sinus rhythm, rate 89, poor R wave progression, Q waves in V3  Calcium score 01/04/20: Coronary calcium score of 471. This was 46 percentile for age and sex matched control.  IMPRESSION: 1.  Aortic Atherosclerosis (ICD10-I70.0).  Recent Labs: 12/03/2019: ALT 20; BUN 13; Creatinine, Ser 0.72; Hemoglobin 14.5; Platelets 203.0; Potassium 4.1; Sodium 137; TSH 3.78  Recent Lipid Panel    Component Value Date/Time   CHOL 190 12/03/2019 1155   TRIG 154.0 (H) 12/03/2019 1155   TRIG 136 07/15/2013 1616   HDL 40.10 12/03/2019 1155   HDL 41 07/15/2013 1616   CHOLHDL 5 12/03/2019 1155   VLDL 30.8 12/03/2019 1155   LDLCALC 119 (H) 12/03/2019 1155   LDLCALC 88 07/15/2013 1616   LDLDIRECT 177.3 02/01/2012 0909    Physical Exam:    VS:  BP (!) 156/93   Pulse 91   Ht 5\' 5"  (1.651 m)   Wt 146 lb 12.8 oz (66.6 kg)   SpO2 98%   BMI 24.43 kg/m     Wt Readings from Last 3 Encounters:  02/26/20 146 lb 12.8 oz (66.6 kg)  02/16/20 148 lb (67.1 kg)  01/26/20 148 lb 6.4 oz (67.3 kg)     GEN:  Well nourished, well developed in no acute distress HEENT: Normal NECK: No JVD CARDIAC: RRR, no murmurs, rubs, gallops RESPIRATORY:  Clear to auscultation without rales, wheezing or rhonchi  ABDOMEN: Soft, non-tender, non-distended MUSCULOSKELETAL:  No edema; No deformity  SKIN: Warm and dry NEUROLOGIC:  Alert and oriented x 3 PSYCHIATRIC:  Normal affect   ASSESSMENT:    1. Coronary artery disease involving native coronary artery of  native heart, angina presence unspecified   2. Essential hypertension   3. Hyperlipidemia, unspecified hyperlipidemia type   4. Tobacco use    PLAN:    Coronary artery disease: calcium score on 01/04/2020 was 471 (98th percentile).  TTE on 02/16/2020 showed LVEF 70 to 01%, grade 1 diastolic dysfunction, normal RV function, mild MR.  Lexiscan Myoview on 02/16/2020 showed normal perfusion. -LDL 119 on 12/03/2019 on  simvastatin 20 mg daily, was switched in May 2021 to atorvastatin 40 mg daily.  Will plan to recheck lipid panel in 2 months and adjust statin to target LDL less than 70  Hyperlipidemia: LDL 119 on 12/03/2019.  Switched from simvastatin to atorvastatin 40 mg daily as above  Hypertension: BP has been elevated at recent clinic visits, will start lisinopril 10 mg daily.  Check BMP in 1 week.  Asked patient to check BP daily for next 2 weeks and call with results  Tobacco use: Patient counseled on the risks of tobacco use and cessation strongly encouraged  RTC in 3 months   Medication Adjustments/Labs and Tests Ordered: Current medicines are reviewed at length with the patient today.  Concerns regarding medicines are outlined above.  Orders Placed This Encounter  Procedures  . Basic metabolic panel   Meds ordered this encounter  Medications  . lisinopril (ZESTRIL) 10 MG tablet    Sig: Take 1 tablet (10 mg total) by mouth daily.    Dispense:  90 tablet    Refill:  3    Patient Instructions  Medication Instructions:  START lisinopril 10 mg daily  *If you need a refill on your cardiac medications before your next appointment, please call your pharmacy*   Lab Work: BMET in [place]  If you have labs (blood work) drawn today and your tests are completely normal, you will receive your results only by: Marland Kitchen MyChart Message (if you have MyChart) OR . A paper copy in the mail If you have any lab test that is abnormal or we need to change your treatment, we will call you to review the  results.  Follow-Up: At [facility], you and your health needs are our priority.  As part of our continuing mission to provide you with exceptional heart care, we have created designated Provider Care Teams.  These Care Teams include your primary Cardiologist (physician) and Advanced Practice Providers (APPs -  Physician Assistants and Nurse Practitioners) who all work together to provide you with the care you need, when you need it.  We recommend signing up for the patient portal called "MyChart".  Sign up information is provided on this After Visit Summary.  MyChart is used to connect with patients for Virtual Visits (Telemedicine).  Patients are able to view lab/test results, encounter notes, upcoming appointments, etc.  Non-urgent messages can be sent to your provider as well.   To learn more about what you can do with MyChart, go to [email or web address].    Your next appointment:   3 month(s)  The format for your next appointment:   In Person  Provider:   Oswaldo Milian, MD   Other Instructions It is recommended to obtain blood pressure cuff for home:  OMRON  Please check your blood pressure at home daily, write it down.  Call the office of send message via Mychart with the readings in 2 weeks for Dr. Gardiner Rhyme to review.       Signed, Donato Heinz, MD  02/26/2020 10:01 AM    [place]

## 2020-02-26 ENCOUNTER — Encounter: Payer: Self-pay | Admitting: Cardiology

## 2020-02-26 ENCOUNTER — Other Ambulatory Visit: Payer: Self-pay

## 2020-02-26 ENCOUNTER — Ambulatory Visit (INDEPENDENT_AMBULATORY_CARE_PROVIDER_SITE_OTHER): Payer: PRIVATE HEALTH INSURANCE | Admitting: Cardiology

## 2020-02-26 VITALS — BP 156/93 | HR 91 | Ht 65.0 in | Wt 146.8 lb

## 2020-02-26 DIAGNOSIS — E785 Hyperlipidemia, unspecified: Secondary | ICD-10-CM

## 2020-02-26 DIAGNOSIS — I2510 Atherosclerotic heart disease of native coronary artery without angina pectoris: Secondary | ICD-10-CM

## 2020-02-26 DIAGNOSIS — I10 Essential (primary) hypertension: Secondary | ICD-10-CM | POA: Diagnosis not present

## 2020-02-26 DIAGNOSIS — Z720 Tobacco use: Secondary | ICD-10-CM

## 2020-02-26 MED ORDER — LISINOPRIL 10 MG PO TABS
10.0000 mg | ORAL_TABLET | Freq: Every day | ORAL | 3 refills | Status: DC
Start: 2020-02-26 — End: 2020-06-06

## 2020-02-26 NOTE — Patient Instructions (Signed)
Medication Instructions:  START lisinopril 10 mg daily  *If you need a refill on your cardiac medications before your next appointment, please call your pharmacy*   Lab Work: BMET in [place]  If you have labs (blood work) drawn today and your tests are completely normal, you will receive your results only by:  [place] (if you have MyChart) OR  A paper copy in the mail If you have any lab test that is abnormal or we need to change your treatment, we will call you to review the results.  Follow-Up: At [facility], you and your health needs are our priority.  As part of our continuing mission to provide you with exceptional heart care, we have created designated Provider Care Teams.  These Care Teams include your primary Cardiologist (physician) and Advanced Practice Providers (APPs -  Physician Assistants and Nurse Practitioners) who all work together to provide you with the care you need, when you need it.  We recommend signing up for the patient portal called "MyChart".  Sign up information is provided on this After Visit Summary.  MyChart is used to connect with patients for Virtual Visits (Telemedicine).  Patients are able to view lab/test results, encounter notes, upcoming appointments, etc.  Non-urgent messages can be sent to your provider as well.   To learn more about what you can do with MyChart, go to [email or web address].    Your next appointment:   3 month(s)  The format for your next appointment:   In Person  Provider:   Oswaldo Milian, MD   Other Instructions It is recommended to obtain blood pressure cuff for home:  OMRON  Please check your blood pressure at home daily, write it down.  Call the office of send message via Mychart with the readings in 2 weeks for Dr. Gardiner Rhyme to review.

## 2020-03-02 ENCOUNTER — Encounter: Payer: Self-pay | Admitting: *Deleted

## 2020-03-05 LAB — BASIC METABOLIC PANEL
BUN/Creatinine Ratio: 18 (ref 12–28)
BUN: 13 mg/dL (ref 8–27)
CO2: 22 mmol/L (ref 20–29)
Calcium: 9.4 mg/dL (ref 8.7–10.3)
Chloride: 104 mmol/L (ref 96–106)
Creatinine, Ser: 0.72 mg/dL (ref 0.57–1.00)
GFR calc Af Amer: 105 mL/min/{1.73_m2} (ref >59)
GFR calc non Af Amer: 91 mL/min/{1.73_m2} (ref >59)
Glucose: 91 mg/dL (ref 65–99)
Potassium: 5.2 mmol/L (ref 3.5–5.2)
Sodium: 142 mmol/L (ref 134–144)

## 2020-03-07 ENCOUNTER — Ambulatory Visit: Payer: PRIVATE HEALTH INSURANCE | Admitting: Physician Assistant

## 2020-03-07 ENCOUNTER — Encounter: Payer: Self-pay | Admitting: Physician Assistant

## 2020-03-07 ENCOUNTER — Other Ambulatory Visit: Payer: Self-pay

## 2020-03-07 DIAGNOSIS — D485 Neoplasm of uncertain behavior of skin: Secondary | ICD-10-CM

## 2020-03-07 DIAGNOSIS — Z86018 Personal history of other benign neoplasm: Secondary | ICD-10-CM

## 2020-03-07 DIAGNOSIS — C4492 Squamous cell carcinoma of skin, unspecified: Secondary | ICD-10-CM

## 2020-03-07 DIAGNOSIS — L820 Inflamed seborrheic keratosis: Secondary | ICD-10-CM

## 2020-03-07 DIAGNOSIS — Z87898 Personal history of other specified conditions: Secondary | ICD-10-CM

## 2020-03-07 HISTORY — DX: Squamous cell carcinoma of skin, unspecified: C44.92

## 2020-03-07 NOTE — Patient Instructions (Signed)

## 2020-03-07 NOTE — Progress Notes (Signed)
   Follow up Visit  Subjective  Sheila Harrington is a 59 y.o. female who presents for the following: Skin Problem (CHEST NEW LESION PAINFUL X 1 MONTH). Spot on chest. Seat belt is rubbing and it has gotten sore. It has been there just over a month. Spot on left upper arm and the left shin that are new. The one on the arm is slightly raised. They are not sore.   Objective  Well appearing patient in no apparent distress; mood and affect are within normal limits.  An examination was performed including head, eyes,  neck, chest, axillae, abdomen, back, buttocks, bilateral upper extremities, bilateral lower extremities, hands, feet, fingers, toes, fingernails, and toenails. All findings within normal limits unless otherwise noted below. No suspicious moles noted on back.   Objective  Neck - Anterior: Warty papule     Objective  Right Abdomen (side) - Lower: Mild. 2016  Objective  Left Lower Leg - Anterior, Left Upper Arm - Anterior, Right Nasal Sidewall: Erythematous stuck-on, waxy papule or plaque.   Assessment & Plan  Neoplasm of uncertain behavior of skin Neck - Anterior  Skin / nail biopsy Type of biopsy: tangential   Informed consent: discussed and consent obtained   Timeout: patient name, date of birth, surgical site, and procedure verified   Procedure prep:  Patient was prepped and draped in usual sterile fashion (Non sterile) Prep type:  Chlorhexidine Anesthesia: the lesion was anesthetized in a standard fashion   Anesthetic:  1% lidocaine w/ epinephrine 1-100,000 local infiltration Instrument used: flexible razor blade   Outcome: patient tolerated procedure well   Post-procedure details: wound care instructions given    Specimen 1 - Surgical pathology Differential Diagnosis: R/O KA Check Margins: No  History of atypical nevus Right Abdomen (side) - Lower  Inflamed seborrheic keratosis (3) Left Upper Arm - Anterior; Left Lower Leg - Anterior; Right Nasal  Sidewall  Destruction of lesion - Left Lower Leg - Anterior, Left Upper Arm - Anterior, Right Nasal Sidewall Complexity: simple   Destruction method: cryotherapy   Informed consent: discussed and consent obtained   Timeout:  patient name, date of birth, surgical site, and procedure verified Lesion destroyed using liquid nitrogen: Yes   Outcome: patient tolerated procedure well with no complications

## 2020-03-31 ENCOUNTER — Other Ambulatory Visit: Payer: Self-pay

## 2020-03-31 ENCOUNTER — Encounter: Payer: Self-pay | Admitting: Physician Assistant

## 2020-03-31 ENCOUNTER — Ambulatory Visit (INDEPENDENT_AMBULATORY_CARE_PROVIDER_SITE_OTHER): Payer: PRIVATE HEALTH INSURANCE | Admitting: Physician Assistant

## 2020-03-31 DIAGNOSIS — C4492 Squamous cell carcinoma of skin, unspecified: Secondary | ICD-10-CM

## 2020-03-31 DIAGNOSIS — C4442 Squamous cell carcinoma of skin of scalp and neck: Secondary | ICD-10-CM | POA: Diagnosis not present

## 2020-03-31 DIAGNOSIS — L820 Inflamed seborrheic keratosis: Secondary | ICD-10-CM

## 2020-03-31 NOTE — Patient Instructions (Signed)

## 2020-03-31 NOTE — Progress Notes (Signed)
   Follow up Visit  Subjective  Sheila Harrington is a 59 y.o. female who presents for the following: Procedure. She says it has tried to grow back since the biopsy. She has scratched it off. She also has a spot under the left eye. Has been there a few weeks. It is a small raised place but is not sore and it hasn't bled.   Objective  Well appearing patient in no apparent distress; mood and affect are within normal limits.  A focused examination was performed including face and chest. Relevant physical exam findings are noted in the Assessment and Plan.   Objective  Neck - Anterior: Biopsy scar identified.   Objective  Left Malar Cheek: Erythematous stuck-on, waxy papule or plaque.   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  Destruction of lesion Complexity: simple   Destruction method: electrodesiccation and curettage   Informed consent: discussed and consent obtained   Timeout:  patient name, date of birth, surgical site, and procedure verified Anesthesia: the lesion was anesthetized in a standard fashion   Anesthetic:  1% lidocaine w/ epinephrine 1-100,000 local infiltration Curettage performed in three different directions: Yes   Curettage cycles:  3 Lesion length (cm):  1.2 Lesion width (cm):  1 Margin per side (cm):  0 Final wound size (cm):  1.2 Hemostasis achieved with:  ferric subsulfate Outcome: patient tolerated procedure well with no complications   Additional details:  Wound innoculated with 5 fluorouracil solution.  Inflamed seborrheic keratosis Left Malar Cheek  Destruction of lesion - Left Malar Cheek Complexity: simple   Destruction method: cryotherapy   Informed consent: discussed and consent obtained   Timeout:  patient name, date of birth, surgical site, and procedure verified Lesion destroyed using liquid nitrogen: Yes   Outcome: patient tolerated procedure well with no complications

## 2020-05-05 ENCOUNTER — Encounter: Payer: Self-pay | Admitting: Physician Assistant

## 2020-06-06 ENCOUNTER — Ambulatory Visit: Payer: PRIVATE HEALTH INSURANCE | Admitting: Internal Medicine

## 2020-06-06 ENCOUNTER — Other Ambulatory Visit: Payer: Self-pay | Admitting: Internal Medicine

## 2020-06-06 ENCOUNTER — Other Ambulatory Visit: Payer: Self-pay

## 2020-06-06 ENCOUNTER — Encounter: Payer: Self-pay | Admitting: Internal Medicine

## 2020-06-06 VITALS — BP 158/78 | HR 74 | Temp 98.4°F | Ht 65.0 in | Wt 144.0 lb

## 2020-06-06 DIAGNOSIS — Z23 Encounter for immunization: Secondary | ICD-10-CM | POA: Diagnosis not present

## 2020-06-06 DIAGNOSIS — R05 Cough: Secondary | ICD-10-CM | POA: Diagnosis not present

## 2020-06-06 DIAGNOSIS — I2510 Atherosclerotic heart disease of native coronary artery without angina pectoris: Secondary | ICD-10-CM | POA: Diagnosis not present

## 2020-06-06 DIAGNOSIS — F17200 Nicotine dependence, unspecified, uncomplicated: Secondary | ICD-10-CM

## 2020-06-06 DIAGNOSIS — R059 Cough, unspecified: Secondary | ICD-10-CM

## 2020-06-06 DIAGNOSIS — R030 Elevated blood-pressure reading, without diagnosis of hypertension: Secondary | ICD-10-CM | POA: Insufficient documentation

## 2020-06-06 DIAGNOSIS — I2583 Coronary atherosclerosis due to lipid rich plaque: Secondary | ICD-10-CM

## 2020-06-06 DIAGNOSIS — E785 Hyperlipidemia, unspecified: Secondary | ICD-10-CM

## 2020-06-06 MED ORDER — LOSARTAN POTASSIUM 50 MG PO TABS
50.0000 mg | ORAL_TABLET | Freq: Every day | ORAL | 3 refills | Status: DC
Start: 2020-06-06 — End: 2020-06-20

## 2020-06-06 MED ORDER — VITAMIN D3 50 MCG (2000 UT) PO CAPS: 2000.0000 [IU] | ORAL_CAPSULE | Freq: Every day | ORAL | 3 refills | Status: DC

## 2020-06-06 NOTE — Addendum Note (Signed)
Addended by: Lauralee Evener C on: 06/06/2020 01:50 PM   Modules accepted: Orders

## 2020-06-06 NOTE — Progress Notes (Signed)
Subjective:  Patient ID: Sheila Harrington, female    DOB: [birth year]  Age: 59 y.o. MRN: [id number]  CC: No chief complaint on file.   HPI Sheila Harrington presents for elevated BP, CAD, dyslipidemia C/o dry cough, nasal congestion  Outpatient Medications Prior to Visit  Medication Sig Dispense Refill  . ALPRAZolam (XANAX) 0.25 MG tablet Take 1 tablet (0.25 mg total) by mouth 2 (two) times daily as needed for anxiety. 30 tablet 1  . aspirin EC 81 MG tablet Take 1 tablet (81 mg total) by mouth daily. 100 tablet 3  . lisinopril (ZESTRIL) 10 MG tablet Take 1 tablet (10 mg total) by mouth daily. (Patient taking differently: Take 10 mg by mouth as needed. Half a tablet as needed) 90 tablet 3  . atorvastatin (LIPITOR) 40 MG tablet Take 1 tablet (40 mg total) by mouth daily. 90 tablet 3   No facility-administered medications prior to visit.    ROS: Review of Systems  Constitutional: Negative for activity change, appetite change, chills, fatigue and unexpected weight change.  HENT: Positive for congestion. Negative for mouth sores and sinus pressure.   Eyes: Negative for visual disturbance.  Respiratory: Positive for cough. Negative for chest tightness.   Gastrointestinal: Negative for abdominal pain and nausea.  Genitourinary: Negative for difficulty urinating, frequency and vaginal pain.  Musculoskeletal: Negative for back pain and gait problem.  Skin: Negative for pallor and rash.  Neurological: Negative for dizziness, tremors, weakness, numbness and headaches.  Psychiatric/Behavioral: Negative for confusion and sleep disturbance.    Objective:  BP (!) 158/78 (BP Location: Left Arm, Patient Position: Sitting, Cuff Size: Normal)   Pulse 74   Temp 98.4 F (36.9 C) (Oral)   Ht 5\' 5"  (1.651 m)   Wt 144 lb (65.3 kg)   SpO2 97%   BMI 23.96 kg/m   BP Readings from Last 3 Encounters:  06/06/20 (!) 158/78  02/26/20 (!) 156/93  01/26/20 (!) 144/86    Wt Readings from Last 3  Encounters:  06/06/20 144 lb (65.3 kg)  02/26/20 146 lb 12.8 oz (66.6 kg)  02/16/20 148 lb (67.1 kg)    Physical Exam  Lab Results  Component Value Date   WBC 7.2 12/03/2019   HGB 14.5 12/03/2019   HCT 43.2 12/03/2019   PLT 203.0 12/03/2019   GLUCOSE 91 03/04/2020   CHOL 190 12/03/2019   TRIG 154.0 (H) 12/03/2019   HDL 40.10 12/03/2019   LDLDIRECT 177.3 02/01/2012   LDLCALC 119 (H) 12/03/2019   ALT 20 12/03/2019   AST 20 12/03/2019   NA 142 03/04/2020   K 5.2 03/04/2020   CL 104 03/04/2020   CREATININE 0.72 03/04/2020   BUN 13 03/04/2020   CO2 22 03/04/2020   TSH 3.78 12/03/2019    CT CARDIAC SCORING  Addendum Date: 01/04/2020   ADDENDUM REPORT: 01/04/2020 18:10 CLINICAL DATA:  Risk stratification EXAM: Coronary Calcium Score TECHNIQUE: The patient was scanned on a [employer] scanner. Axial non-contrast 3 mm slices were carried out through the heart. The data set was analyzed on a dedicated work station and scored using the Green. FINDINGS: Non-cardiac: See separate report from [facility]. Ascending Aorta: Normal size, mild diffuse calcifications. Pericardium: Normal. Coronary arteries: Normal origin. IMPRESSION: Coronary calcium score of 471. This was 61 percentile for age and sex matched control. Electronically Signed   By: Ena Dawley   On: 01/04/2020 18:10   Result Date: 01/04/2020 EXAM: OVER-READ INTERPRETATION  CT CHEST The following  report is an over-read performed by radiologist Dr. Vinnie Langton of [facility], [place] on 01/04/2020. This over-read does not include interpretation of cardiac or coronary anatomy or pathology. The coronary calcium score interpretation by the cardiologist is attached. COMPARISON:  None. FINDINGS: Aortic atherosclerosis. Within the visualized portions of the thorax there are no suspicious appearing pulmonary nodules or masses, there is no acute consolidative airspace disease, no pleural effusions, no pneumothorax  and no lymphadenopathy. Visualized portions of the upper abdomen are unremarkable. There are no aggressive appearing lytic or blastic lesions noted in the visualized portions of the skeleton. IMPRESSION: 1.  Aortic Atherosclerosis (ICD10-I70.0). Electronically Signed: By: Vinnie Langton M.D. On: 01/04/2020 09:44    Assessment & Plan:    Walker Kehr, MD

## 2020-06-06 NOTE — Assessment & Plan Note (Signed)
Lipitor 

## 2020-06-06 NOTE — Assessment & Plan Note (Signed)
Trying to quit 

## 2020-06-06 NOTE — Assessment & Plan Note (Signed)
BP nl at home Losartan if bp is high

## 2020-06-06 NOTE — Assessment & Plan Note (Signed)
C/o dry cough, nasal congestion - d/c

## 2020-06-06 NOTE — Patient Instructions (Signed)
Normal BP is <130/85

## 2020-06-06 NOTE — Assessment & Plan Note (Addendum)
On lipitor, Aspirin. F/u Dr Gardiner Rhyme

## 2020-06-07 ENCOUNTER — Ambulatory Visit: Payer: PRIVATE HEALTH INSURANCE | Admitting: Cardiology

## 2020-06-07 LAB — LIPID PANEL
Cholesterol: 133 mg/dL (ref <200)
HDL: 38 mg/dL — ABNORMAL LOW (ref >=50)
LDL Cholesterol (Calc): 76 mg/dL (calc)
Non-HDL Cholesterol (Calc): 95 mg/dL (calc) (ref <130)
Total CHOL/HDL Ratio: 3.5 (calc) (ref <5.0)
Triglycerides: 104 mg/dL (ref <150)

## 2020-06-07 LAB — COMPLETE METABOLIC PANEL WITH GFR
AG Ratio: 1.8 (calc) (ref 1.0–2.5)
ALT: 11 U/L (ref 6–29)
AST: 13 U/L (ref 10–35)
Albumin: 4.4 g/dL (ref 3.6–5.1)
Alkaline phosphatase (APISO): 61 U/L (ref 37–153)
BUN: 13 mg/dL (ref 7–25)
CO2: 27 mmol/L (ref 20–32)
Calcium: 9.1 mg/dL (ref 8.6–10.4)
Chloride: 104 mmol/L (ref 98–110)
Creat: 0.69 mg/dL (ref 0.50–0.99)
GFR, Est African American: 110 mL/min/{1.73_m2} (ref >=60)
GFR, Est Non African American: 95 mL/min/{1.73_m2} (ref >=60)
Globulin: 2.5 g/dL (calc) (ref 1.9–3.7)
Glucose, Bld: 92 mg/dL (ref 65–99)
Potassium: 4.6 mmol/L (ref 3.5–5.3)
Sodium: 138 mmol/L (ref 135–146)
Total Bilirubin: 0.8 mg/dL (ref 0.2–1.2)
Total Protein: 6.9 g/dL (ref 6.1–8.1)

## 2020-06-19 NOTE — Progress Notes (Signed)
Cardiology Office Note:    Date:  06/20/2020   ID:  Sheila Harrington, DOB 10/18/1960, MRN [id number]  PCP:  Cassandria Anger, MD  Cardiologist:  No primary care provider on file.  Electrophysiologist:  None   Referring MD: Cassandria Anger, MD   Chief Complaint  Patient presents with  . Coronary Artery Disease    History of Present Illness:    Sheila Harrington is a 59 y.o. female with a hx of hyperlipidemia, tobacco use, anxiety who presents for follow-up.  She was referred by Dr. Alain Marion for evaluation of coronary artery disease, initially seen on 01/26/2020.  She underwent a calcium score on 01/04/2020, which was 471 (98th percentile).  She reports that since she was diagnosed with CAD she started exercising, walking 1 to 1.5 miles per day.  Denies any exertional chest pain.  Does report she has some dyspnea, particularly with walking up hills, but has improved since she started walking.  She denies any lightheadedness, syncope, LE edema, or palpitations.  She has smoked up to 1 pack/day x 30 years.  Currently down to 0.5 pack/day.  She denies any history of heart disease in her immediate family.  TTE on 02/16/2020 showed LVEF 70 to 16%, grade 1 diastolic dysfunction, normal RV function, mild MR.  Lexiscan Myoview on 02/16/2020 showed normal perfusion.  Since last clinic visit, she reports that she stopped taking losartan.  States that BP was down to 100s to 110s on losartan 25 mg daily.  Reports BP has been in 120s since he stopped losartan.  She has been walking 1 mile per day for 25 to 30 minutes.  Not walking on weekends.  She denies any chest pain or dyspnea.  Denies any lightheadedness, syncope, or palpitations.  Reports she continues to smoke, about 8 cigarettes/day.     Past Medical History:  Diagnosis Date  . Anxiety   . Atypical mole 11/22/2014   mild- right abdomen  . Chronic UTI   . Hyperlipidemia   . Personal history of colonic adenoma 11/20/2012   11/2012 - 1 cm  sigmoid adenoma  . Squamous cell carcinoma of skin 03/07/2020   neck, anterior, keratoacanthoma  . Varicella     Past Surgical History:  Procedure Laterality Date  . COLONOSCOPY W/ POLYPECTOMY    . G2P2    . TUBAL LIGATION    . WISDOM TOOTH EXTRACTION      Current Medications: Current Meds  Medication Sig  . ALPRAZolam (XANAX) 0.25 MG tablet Take 1 tablet (0.25 mg total) by mouth 2 (two) times daily as needed for anxiety.  Marland Kitchen aspirin EC 81 MG tablet Take 1 tablet (81 mg total) by mouth daily.  . Cholecalciferol (VITAMIN D3) 50 MCG (2000 UT) capsule Take 1 capsule (2,000 Units total) by mouth daily.  . [DISCONTINUED] losartan (COZAAR) 50 MG tablet Take 1 tablet (50 mg total) by mouth daily.     Allergies:   Zestril [lisinopril]   Social History   Socioeconomic History  . Marital status: Married    Spouse name: Not on file  . Number of children: 2  . Years of education: 44  . Highest education level: Not on file  Occupational History  . Occupation: Insurance account manager  Tobacco Use  . Smoking status: Current Every Day Smoker    Packs/day: 0.50    Years: 10.00    Pack years: 5.00    Types: Cigarettes  . Smokeless tobacco: Never Used  . Tobacco comment: advised  taht it is bad  Vaping Use  . Vaping Use: Never used  Substance and Sexual Activity  . Alcohol use: No    Alcohol/week: 0.0 standard drinks  . Drug use: No  . Sexual activity: Yes    Partners: Male  Other Topics Concern  . Not on file  Social History Narrative   HSG, on the job training. Married - 1986 - 2 dtrs' 89, '93. 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 Marriage is in good health   Social Determinants of Health   Financial Resource Strain:   . Difficulty of Paying Living Expenses: Not on file  Food Insecurity:   . Worried About Charity fundraiser in the Last Year: Not on file  . Ran Out of Food in the Last Year: Not on file  Transportation Needs:   . Lack of Transportation  (Medical): Not on file  . Lack of Transportation (Non-Medical): Not on file  Physical Activity:   . Days of Exercise per Week: Not on file  . Minutes of Exercise per Session: Not on file  Stress:   . Feeling of Stress : Not on file  Social Connections:   . Frequency of Communication with Friends and Family: Not on file  . Frequency of Social Gatherings with Friends and Family: Not on file  . Attends Religious Services: Not on file  . Active Member of Clubs or Organizations: Not on file  . Attends Archivist Meetings: Not on file  . Marital Status: Not on file     Family History: The patient's family history includes Cancer in her maternal grandmother and mother; Hyperlipidemia in her mother; Stroke in her mother. There is no history of Diabetes, Heart disease, or Colon cancer.  ROS:   Please see the history of present illness.     All other systems reviewed and are negative.  EKGs/Labs/Other Studies Reviewed:    The following studies were reviewed today:   EKG:  EKG is not ordered today.  The ekg ordered most recently demonstrates normal sinus rhythm, rate 89, poor R wave progression, Q waves in V3  Calcium score 01/04/20: Coronary calcium score of 471. This was 35 percentile for age and sex matched control.  IMPRESSION: 1.  Aortic Atherosclerosis (ICD10-I70.0).  Recent Labs: 12/03/2019: Hemoglobin 14.5; Platelets 203.0; TSH 3.78 06/06/2020: ALT 11; BUN 13; Creat 0.69; Potassium 4.6; Sodium 138  Recent Lipid Panel    Component Value Date/Time   CHOL 133 06/06/2020 1052   TRIG 104 06/06/2020 1052   TRIG 136 07/15/2013 1616   HDL 38 (L) 06/06/2020 1052   HDL 41 07/15/2013 1616   CHOLHDL 3.5 06/06/2020 1052   VLDL 30.8 12/03/2019 1155   LDLCALC 76 06/06/2020 1052   LDLCALC 88 07/15/2013 1616   LDLDIRECT 177.3 02/01/2012 0909    Physical Exam:    VS:  BP 140/70   Pulse 87   Temp (!) 97.5 F (36.4 C)   Ht 5\' 5"  (1.651 m)   Wt 146 lb 12.8 oz (66.6 kg)    SpO2 96%   BMI 24.43 kg/m     Wt Readings from Last 3 Encounters:  06/20/20 146 lb 12.8 oz (66.6 kg)  06/06/20 144 lb (65.3 kg)  02/26/20 146 lb 12.8 oz (66.6 kg)     GEN:  Well nourished, well developed in no acute distress HEENT: Normal.  R carotid bruit NECK: No JVD CARDIAC: RRR, no murmurs, rubs, gallops RESPIRATORY:  Clear to auscultation  without rales, wheezing or rhonchi  ABDOMEN: Soft, non-tender, non-distended MUSCULOSKELETAL:  No edema; No deformity  SKIN: Warm and dry NEUROLOGIC:  Alert and oriented x 3 PSYCHIATRIC:  Normal affect   ASSESSMENT:    1. Coronary artery disease involving native coronary artery of native heart without angina pectoris   2. Carotid bruit, unspecified laterality   3. Hyperlipidemia, unspecified hyperlipidemia type   4. Tobacco use   5. Essential hypertension    PLAN:    Coronary artery disease: calcium score on 01/04/2020 was 471 (98th percentile).  TTE on 02/16/2020 showed LVEF 70 to 10%, grade 1 diastolic dysfunction, normal RV function, mild MR.  Lexiscan Myoview on 02/16/2020 showed normal perfusion. -LDL 119 on 12/03/2019 on simvastatin 20 mg daily, was switched in May 2021 to atorvastatin 40 mg daily.  Repeat lipid panel on 06/06/2020 showed LDL 76, will increase atorvastatin to 80 mg daily  Right carotid bruit: will check carotid duplex  Hyperlipidemia: LDL 76 on 06/06/2020.  On atorvastatin 40 mg daily, will increase to 80 mg daily as above.  Hypertension: Started on lisinopril last visit, was discontinued due to cough.  She was switched to losartan, but reports BP was running 100s to 110s on losartan 25 mg, so she stopped taking it.  States that BP has been in 120s since she stopped.  Asked patient to take BP daily for next 2 weeks and call with results  Tobacco use: Patient counseled on the risks of tobacco use and cessation strongly encouraged.  Offered assistance to help her quit, but she states that she knows she can quit on her own  will continue to cut back on her smoking.  RTC in 6 months  Medication Adjustments/Labs and Tests Ordered: Current medicines are reviewed at length with the patient today.  Concerns regarding medicines are outlined above.  Orders Placed This Encounter  Procedures  . VAS US CAROTID   Meds ordered this encounter  Medications  . atorvastatin (LIPITOR) 80 MG tablet    Sig: Take 1 tablet (80 mg total) by mouth daily.    Dispense:  90 tablet    Refill:  3    Patient Instructions  Medication Instructions:  INCREASE atorvastatin (Lipitor) to 80 mg daily  *If you need a refill on your cardiac medications before your next appointment, please call your pharmacy*  Testing/Procedures: Your physician has requested that you have a carotid duplex. This test is an ultrasound of the carotid arteries in your neck. It looks at blood flow through these arteries that supply the brain with blood. Allow one hour for this exam. There are no restrictions or special instructions.  Follow-Up: At [facility], you and your health needs are our priority.  As part of our continuing mission to provide you with exceptional heart care, we have created designated Provider Care Teams.  These Care Teams include your primary Cardiologist (physician) and Advanced Practice Providers (APPs -  Physician Assistants and Nurse Practitioners) who all work together to provide you with the care you need, when you need it.  We recommend signing up for the patient portal called "MyChart".  Sign up information is provided on this After Visit Summary.  MyChart is used to connect with patients for Virtual Visits (Telemedicine).  Patients are able to view lab/test results, encounter notes, upcoming appointments, etc.  Non-urgent messages can be sent to your provider as well.   To learn more about what you can do with MyChart, go to [email or web address].  Your next appointment:   6 month(s)  The format for your next  appointment:   In Person  Provider:   Oswaldo Milian, MD   Other Instructions Please check your blood pressure at home daily, write it down.  Call the office or send message via Mychart with the readings in 2 weeks for Dr. Gardiner Rhyme to review.        Signed, Donato Heinz, MD  06/20/2020 5:50 PM    [place]

## 2020-06-20 ENCOUNTER — Other Ambulatory Visit: Payer: Self-pay

## 2020-06-20 ENCOUNTER — Ambulatory Visit (INDEPENDENT_AMBULATORY_CARE_PROVIDER_SITE_OTHER): Payer: PRIVATE HEALTH INSURANCE | Admitting: Cardiology

## 2020-06-20 ENCOUNTER — Encounter: Payer: Self-pay | Admitting: Cardiology

## 2020-06-20 VITALS — BP 140/70 | HR 87 | Temp 97.5°F | Ht 65.0 in | Wt 146.8 lb

## 2020-06-20 DIAGNOSIS — Z720 Tobacco use: Secondary | ICD-10-CM | POA: Diagnosis not present

## 2020-06-20 DIAGNOSIS — I10 Essential (primary) hypertension: Secondary | ICD-10-CM

## 2020-06-20 DIAGNOSIS — R0989 Other specified symptoms and signs involving the circulatory and respiratory systems: Secondary | ICD-10-CM | POA: Diagnosis not present

## 2020-06-20 DIAGNOSIS — E785 Hyperlipidemia, unspecified: Secondary | ICD-10-CM | POA: Diagnosis not present

## 2020-06-20 DIAGNOSIS — I2510 Atherosclerotic heart disease of native coronary artery without angina pectoris: Secondary | ICD-10-CM | POA: Diagnosis not present

## 2020-06-20 MED ORDER — ATORVASTATIN CALCIUM 80 MG PO TABS: 80.0000 mg | ORAL_TABLET | Freq: Every day | ORAL | 3 refills | Status: DC

## 2020-06-20 NOTE — Patient Instructions (Signed)
Medication Instructions:  INCREASE atorvastatin (Lipitor) to 80 mg daily  *If you need a refill on your cardiac medications before your next appointment, please call your pharmacy*  Testing/Procedures: Your physician has requested that you have a carotid duplex. This test is an ultrasound of the carotid arteries in your neck. It looks at blood flow through these arteries that supply the brain with blood. Allow one hour for this exam. There are no restrictions or special instructions.  Follow-Up: At [facility], you and your health needs are our priority.  As part of our continuing mission to provide you with exceptional heart care, we have created designated Provider Care Teams.  These Care Teams include your primary Cardiologist (physician) and Advanced Practice Providers (APPs -  Physician Assistants and Nurse Practitioners) who all work together to provide you with the care you need, when you need it.  We recommend signing up for the patient portal called "MyChart".  Sign up information is provided on this After Visit Summary.  MyChart is used to connect with patients for Virtual Visits (Telemedicine).  Patients are able to view lab/test results, encounter notes, upcoming appointments, etc.  Non-urgent messages can be sent to your provider as well.   To learn more about what you can do with MyChart, go to [email or web address].    Your next appointment:   6 month(s)  The format for your next appointment:   In Person  Provider:   Oswaldo Milian, MD   Other Instructions Please check your blood pressure at home daily, write it down.  Call the office or send message via Mychart with the readings in 2 weeks for Dr. Gardiner Rhyme to review.

## 2020-06-29 ENCOUNTER — Other Ambulatory Visit: Payer: Self-pay

## 2020-06-29 ENCOUNTER — Ambulatory Visit: Payer: PRIVATE HEALTH INSURANCE | Admitting: Dermatology

## 2020-06-29 ENCOUNTER — Ambulatory Visit: Payer: PRIVATE HEALTH INSURANCE | Admitting: Physician Assistant

## 2020-06-29 ENCOUNTER — Ambulatory Visit (HOSPITAL_COMMUNITY)
Admission: RE | Admit: 2020-06-29 | Payer: PRIVATE HEALTH INSURANCE | Source: Ambulatory Visit | Attending: Cardiology | Admitting: Cardiology

## 2020-06-29 ENCOUNTER — Encounter: Payer: Self-pay | Admitting: Dermatology

## 2020-06-29 DIAGNOSIS — L821 Other seborrheic keratosis: Secondary | ICD-10-CM

## 2020-06-29 DIAGNOSIS — Z85828 Personal history of other malignant neoplasm of skin: Secondary | ICD-10-CM

## 2020-06-29 DIAGNOSIS — L570 Actinic keratosis: Secondary | ICD-10-CM

## 2020-06-29 NOTE — Progress Notes (Signed)
Scc/ka treated by jcb - Chest clear per st healed a little darker  Right leg ak will ln2 Left upper thigh cn clear  Post neck isk & ak Face clear follow up x 1 year

## 2020-07-12 ENCOUNTER — Ambulatory Visit (HOSPITAL_COMMUNITY)
Admission: RE | Admit: 2020-07-12 | Discharge: 2020-07-12 | Disposition: A | Discharge status: Home / Self Care | Payer: PRIVATE HEALTH INSURANCE | Source: Ambulatory Visit | Attending: Cardiovascular Disease | Admitting: Cardiovascular Disease

## 2020-07-12 ENCOUNTER — Other Ambulatory Visit: Payer: Self-pay

## 2020-07-12 DIAGNOSIS — R0989 Other specified symptoms and signs involving the circulatory and respiratory systems: Secondary | ICD-10-CM | POA: Diagnosis not present

## 2020-07-18 ENCOUNTER — Other Ambulatory Visit: Payer: Self-pay | Admitting: Internal Medicine

## 2020-08-25 ENCOUNTER — Telehealth: Payer: Self-pay | Admitting: Internal Medicine

## 2020-08-25 DIAGNOSIS — N3000 Acute cystitis without hematuria: Secondary | ICD-10-CM

## 2020-08-25 NOTE — Telephone Encounter (Signed)
    Patient calling to report she has frequent urination and back pain. She thinks she has a UTI She is requesting medication be called to pharmacy because she is unable to be off work (declined appt) Can urine test be ordered

## 2020-08-26 NOTE — Telephone Encounter (Signed)
Okay UA.  Virtual office visit with any provider ASAP.

## 2020-08-26 NOTE — Telephone Encounter (Signed)
Called pt there was no answer LMOM MD ok UA can go to [facility] to give specimen.Marland KitchenJohny Harrington

## 2020-09-01 ENCOUNTER — Other Ambulatory Visit (INDEPENDENT_AMBULATORY_CARE_PROVIDER_SITE_OTHER): Payer: PRIVATE HEALTH INSURANCE

## 2020-09-01 ENCOUNTER — Other Ambulatory Visit: Payer: Self-pay

## 2020-09-01 DIAGNOSIS — N3000 Acute cystitis without hematuria: Secondary | ICD-10-CM | POA: Diagnosis not present

## 2020-09-01 LAB — URINALYSIS, ROUTINE W REFLEX MICROSCOPIC
Bilirubin Urine: NEGATIVE
Ketones, ur: NEGATIVE
Leukocytes,Ua: NEGATIVE
Nitrite: NEGATIVE
Specific Gravity, Urine: 1.015 (ref 1.000–1.030)
Total Protein, Urine: NEGATIVE
Urine Glucose: NEGATIVE
Urobilinogen, UA: 0.2 (ref 0.0–1.0)
pH: 7 (ref 5.0–8.0)

## 2020-12-05 ENCOUNTER — Other Ambulatory Visit: Payer: Self-pay

## 2020-12-05 ENCOUNTER — Ambulatory Visit: Payer: PRIVATE HEALTH INSURANCE | Admitting: Internal Medicine

## 2020-12-05 ENCOUNTER — Encounter: Payer: Self-pay | Admitting: Internal Medicine

## 2020-12-05 DIAGNOSIS — I2510 Atherosclerotic heart disease of native coronary artery without angina pectoris: Secondary | ICD-10-CM | POA: Diagnosis not present

## 2020-12-05 DIAGNOSIS — J019 Acute sinusitis, unspecified: Secondary | ICD-10-CM | POA: Insufficient documentation

## 2020-12-05 DIAGNOSIS — F419 Anxiety disorder, unspecified: Secondary | ICD-10-CM | POA: Insufficient documentation

## 2020-12-05 DIAGNOSIS — I2583 Coronary atherosclerosis due to lipid rich plaque: Secondary | ICD-10-CM

## 2020-12-05 DIAGNOSIS — E785 Hyperlipidemia, unspecified: Secondary | ICD-10-CM

## 2020-12-05 LAB — COMPREHENSIVE METABOLIC PANEL
ALT: 16 U/L (ref 0–35)
AST: 16 U/L (ref 0–37)
Albumin: 4.5 g/dL (ref 3.5–5.2)
Alkaline Phosphatase: 62 U/L (ref 39–117)
BUN: 13 mg/dL (ref 6–23)
CO2: 27 mEq/L (ref 19–32)
Calcium: 9.4 mg/dL (ref 8.4–10.5)
Chloride: 104 mEq/L (ref 96–112)
Creatinine, Ser: 0.65 mg/dL (ref 0.40–1.20)
GFR: 95.03 mL/min (ref >60.00)
Glucose, Bld: 102 mg/dL — ABNORMAL HIGH (ref 70–99)
Potassium: 4.2 mEq/L (ref 3.5–5.1)
Sodium: 140 mEq/L (ref 135–145)
Total Bilirubin: 0.8 mg/dL (ref 0.2–1.2)
Total Protein: 7.3 g/dL (ref 6.0–8.3)

## 2020-12-05 LAB — LIPID PANEL
Cholesterol: 129 mg/dL (ref 0–200)
HDL: 39.9 mg/dL (ref >39.00)
LDL Cholesterol: 60 mg/dL (ref 0–99)
NonHDL: 89.12
Total CHOL/HDL Ratio: 3
Triglycerides: 148 mg/dL (ref 0.0–149.0)
VLDL: 29.6 mg/dL (ref 0.0–40.0)

## 2020-12-05 LAB — TSH: TSH: 3.01 u[IU]/mL (ref 0.35–4.50)

## 2020-12-05 MED ORDER — ATORVASTATIN CALCIUM 80 MG PO TABS: 80.0000 mg | ORAL_TABLET | Freq: Every day | ORAL | 3 refills | Status: DC

## 2020-12-05 MED ORDER — ALPRAZOLAM 0.25 MG PO TABS: ORAL_TABLET | ORAL | 2 refills | Status: DC

## 2020-12-05 MED ORDER — CEFDINIR 300 MG PO CAPS: 300.0000 mg | ORAL_CAPSULE | Freq: Two times a day (BID) | ORAL | 0 refills | Status: DC

## 2020-12-05 MED ORDER — VITAMIN D3 50 MCG (2000 UT) PO CAPS: 2000.0000 [IU] | ORAL_CAPSULE | Freq: Every day | ORAL | 3 refills | Status: AC

## 2020-12-05 NOTE — Addendum Note (Signed)
Addended by: Boris Lown B on: 12/05/2020 09:03 AM   Modules accepted: Orders

## 2020-12-05 NOTE — Assessment & Plan Note (Signed)
Try Cefdinir

## 2020-12-05 NOTE — Assessment & Plan Note (Signed)
On Xanax - rare use  Potential benefits of a long term benzodiazepines  use as well as potential risks  and complications were explained to the patient and were aknowledged.

## 2020-12-05 NOTE — Progress Notes (Signed)
Subjective:  Patient ID: Sheila Harrington, female    DOB: [birth year]  Age: 60 y.o. MRN: [id number]  CC: Follow-up (6 month f/u)   HPI Sheila Harrington presents for dyslipidemia, anxiety C/o sinus drainage, occ bloody C/o leg pain when standing up - no pain when wearing sneakers.  Outpatient Medications Prior to Visit  Medication Sig Dispense Refill  . ALPRAZolam (XANAX) 0.25 MG tablet TAKE (1) TABLET BY MOUTH TWICE DAILY AS NEEDED FOR ANXIETY 30 tablet 2  . aspirin EC 81 MG tablet Take 1 tablet (81 mg total) by mouth daily. 100 tablet 3  . Multiple Vitamin (MULTIVITAMIN) tablet Take 1 tablet by mouth daily.    . vitamin C (ASCORBIC ACID) 500 MG tablet Take 500 mg by mouth daily.    Marland Kitchen atorvastatin (LIPITOR) 80 MG tablet Take 1 tablet (80 mg total) by mouth daily. 90 tablet 3  . Cholecalciferol (VITAMIN D3) 50 MCG (2000 UT) capsule Take 1 capsule (2,000 Units total) by mouth daily. (Patient not taking: Reported on 12/05/2020) 100 capsule 3   No facility-administered medications prior to visit.    ROS: Review of Systems  Constitutional: Negative for activity change, appetite change, chills, fatigue and unexpected weight change.  HENT: Positive for congestion, sinus pressure and sinus pain. Negative for mouth sores.   Eyes: Negative for visual disturbance.  Respiratory: Negative for cough and chest tightness.   Gastrointestinal: Negative for abdominal pain and nausea.  Genitourinary: Negative for difficulty urinating, frequency and vaginal pain.  Musculoskeletal: Negative for back pain and gait problem.  Skin: Negative for pallor and rash.  Neurological: Negative for dizziness, tremors, weakness, numbness and headaches.  Psychiatric/Behavioral: Negative for confusion, sleep disturbance and suicidal ideas. The patient is nervous/anxious.     Objective:  BP (!) 142/78 (BP Location: Left Arm)   Pulse 92   Temp 98 F (36.7 C) (Oral)   Ht 5\' 5"  (1.651 m)   Wt 152 lb (68.9 kg)    SpO2 97%   BMI 25.29 kg/m   BP Readings from Last 3 Encounters:  12/05/20 (!) 142/78  06/20/20 140/70  06/06/20 (!) 158/78    Wt Readings from Last 3 Encounters:  12/05/20 152 lb (68.9 kg)  06/20/20 146 lb 12.8 oz (66.6 kg)  06/06/20 144 lb (65.3 kg)    Physical Exam Constitutional:      General: She is not in acute distress.    Appearance: She is well-developed.  HENT:     Head: Normocephalic.     Right Ear: External ear normal.     Left Ear: External ear normal.     Nose: Nose normal.  Eyes:     General:        Right eye: No discharge.        Left eye: No discharge.     Conjunctiva/sclera: Conjunctivae normal.     Pupils: Pupils are equal, round, and reactive to light.  Neck:     Thyroid: No thyromegaly.     Vascular: No JVD.     Trachea: No tracheal deviation.  Cardiovascular:     Rate and Rhythm: Normal rate and regular rhythm.     Heart sounds: Normal heart sounds.  Pulmonary:     Effort: No respiratory distress.     Breath sounds: No stridor. No wheezing.  Abdominal:     General: Bowel sounds are normal. There is no distension.     Palpations: Abdomen is soft. There is no mass.  Tenderness: There is no abdominal tenderness. There is no guarding or rebound.  Musculoskeletal:        General: No tenderness.     Cervical back: Normal range of motion and neck supple.  Lymphadenopathy:     Cervical: No cervical adenopathy.  Skin:    Findings: No erythema or rash.  Neurological:     Cranial Nerves: No cranial nerve deficit.     Motor: No abnormal muscle tone.     Coordination: Coordination normal.     Deep Tendon Reflexes: Reflexes normal.  Psychiatric:        Behavior: Behavior normal.        Thought Content: Thought content normal.        Judgment: Judgment normal.    Nasal lining - irritated   Lab Results  Component Value Date   WBC 7.2 12/03/2019   HGB 14.5 12/03/2019   HCT 43.2 12/03/2019   PLT 203.0 12/03/2019   GLUCOSE 92 06/06/2020    CHOL 133 06/06/2020   TRIG 104 06/06/2020   HDL 38 (L) 06/06/2020   LDLDIRECT 177.3 02/01/2012   LDLCALC 76 06/06/2020   ALT 11 06/06/2020   AST 13 06/06/2020   NA 138 06/06/2020   K 4.6 06/06/2020   CL 104 06/06/2020   CREATININE 0.69 06/06/2020   BUN 13 06/06/2020   CO2 27 06/06/2020   TSH 3.78 12/03/2019    VAS US CAROTID  Result Date: 07/13/2020 Carotid Arterial Duplex Study Indications:  Right bruit.               Patient denies cerebrovascular symptoms at this time. Risk Factors: Hyperlipidemia, current smoker, coronary artery disease. Performing Technologist: Mariane Masters RVT  Examination Guidelines: A complete evaluation includes B-mode imaging, spectral Doppler, color Doppler, and power Doppler as needed of all accessible portions of each vessel. Bilateral testing is considered an integral part of a complete examination. Limited examinations for reoccurring indications may be performed as noted.  Right Carotid Findings: +----------+--------+--------+--------+------------------+--------+           PSV cm/sEDV cm/sStenosisPlaque DescriptionComments +----------+--------+--------+--------+------------------+--------+ CCA Prox  1       0                                          +----------+--------+--------+--------+------------------+--------+ CCA Distal1       0                                          +----------+--------+--------+--------+------------------+--------+ ICA Prox  1       0       Normal                             +----------+--------+--------+--------+------------------+--------+ ICA Mid   1       0                                          +----------+--------+--------+--------+------------------+--------+ ICA Distal1       0                                          +----------+--------+--------+--------+------------------+--------+  ECA       1       0                                           +----------+--------+--------+--------+------------------+--------+ +----------+--------+-------+----------------+-------------------+           PSV cm/sEDV cmsDescribe        Arm Pressure (mmHG) +----------+--------+-------+----------------+-------------------+ Jarrett Ables              Multiphasic, [id number]                 +----------+--------+-------+----------------+-------------------+ +---------+--------+-+--------+-+---------+ VertebralPSV cm/s0EDV cm/s0Antegrade +---------+--------+-+--------+-+---------+  Left Carotid Findings: +----------+--------+--------+--------+------------------+------------------+           PSV cm/sEDV cm/sStenosisPlaque DescriptionComments           +----------+--------+--------+--------+------------------+------------------+ CCA Prox  1       0                                                    +----------+--------+--------+--------+------------------+------------------+ CCA Distal1       0                                 intimal thickening +----------+--------+--------+--------+------------------+------------------+ ICA Prox  1       0               heterogenous                         +----------+--------+--------+--------+------------------+------------------+ ICA Mid   1       0       1-39%                                        +----------+--------+--------+--------+------------------+------------------+ ICA Distal1       0                                                    +----------+--------+--------+--------+------------------+------------------+ ECA       3       0       >50%                                         +----------+--------+--------+--------+------------------+------------------+ +----------+--------+--------+----------------+-------------------+           PSV cm/sEDV cm/sDescribe        Arm Pressure (mmHG) +----------+--------+--------+----------------+-------------------+ Mathis Fare                Multiphasic, [id number]                 +----------+--------+--------+----------------+-------------------+ +---------+--------+-+--------+-+---------+ VertebralPSV cm/s1EDV cm/s0Antegrade +---------+--------+-+--------+-+---------+   Summary: Right Carotid: There is no evidence of stenosis in the right ICA. The                extracranial vessels were near-normal with only minimal wall  thickening or plaque. Left Carotid: Velocities in the left ICA are consistent with a 1-39% stenosis.               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 Vertebrals:  Bilateral vertebral arteries demonstrate antegrade flow. Subclavians: Normal flow hemodynamics were seen in bilateral subclavian              arteries. *See table(s) above for measurements and observations.  Electronically signed by Kathlyn Sacramento MD on 07/13/2020 at 4:23:33 PM.    Final     Assessment & Plan:    Walker Kehr, MD

## 2020-12-05 NOTE — Assessment & Plan Note (Signed)
On Lipitor, ASA No CP

## 2020-12-05 NOTE — Assessment & Plan Note (Signed)
On lipitor

## 2021-04-16 ENCOUNTER — Encounter: Payer: Self-pay | Admitting: Dermatology

## 2021-04-16 NOTE — Progress Notes (Signed)
   Follow-Up Visit   Subjective  BEAUTY RIGHI is a 60 y.o. female who presents for the following: Follow-up (3 month recheck- left chest- came back & I picked it off).  Check site of skin cancer left chest, and new crust on leg, check back. Location:  Duration:  Quality:  Associated Signs/Symptoms: Modifying Factors:  Severity:  Timing: Context:   Objective  Well appearing patient in no apparent distress; mood and affect are within normal limits. 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  Clinically there is no sign of residual and since KA's are known to spontaneously self resolve, observation would be appropriate without further intervention.  Neck - Posterior Light brown 5 mm flattopped textured papules    Head, neck, back, chest, arms, legs.   Assessment & Plan    AK (actinic keratosis) Right Thigh - Anterior  Destruction of lesion - Right Thigh - Anterior Complexity: simple   Destruction method: cryotherapy   Informed consent: discussed and consent obtained   Lesion destroyed using liquid nitrogen: Yes   Cryotherapy cycles:  3 Outcome: patient tolerated procedure well with no complications    Personal history of skin cancer Left Breast  Return if there is any sign of recurrence.  Seborrheic keratosis Neck - Posterior  Leave if stable      I, Lavonna Monarch, MD, have reviewed all documentation for this visit.  The documentation on 04/16/21 for the exam, diagnosis, procedures, and orders are all accurate and complete.

## 2021-06-12 ENCOUNTER — Encounter: Payer: PRIVATE HEALTH INSURANCE | Admitting: Internal Medicine

## 2021-06-13 ENCOUNTER — Other Ambulatory Visit: Payer: Self-pay

## 2021-06-13 ENCOUNTER — Ambulatory Visit (INDEPENDENT_AMBULATORY_CARE_PROVIDER_SITE_OTHER): Payer: PRIVATE HEALTH INSURANCE | Admitting: Internal Medicine

## 2021-06-13 ENCOUNTER — Encounter: Payer: Self-pay | Admitting: Internal Medicine

## 2021-06-13 VITALS — BP 146/80 | HR 94 | Temp 98.1°F | Ht 65.0 in | Wt 148.4 lb

## 2021-06-13 DIAGNOSIS — Z0000 Encounter for general adult medical examination without abnormal findings: Secondary | ICD-10-CM

## 2021-06-13 DIAGNOSIS — F17200 Nicotine dependence, unspecified, uncomplicated: Secondary | ICD-10-CM

## 2021-06-13 LAB — LIPID PANEL
Cholesterol: 123 mg/dL (ref 0–200)
HDL: 35.2 mg/dL — ABNORMAL LOW (ref >39.00)
LDL Cholesterol: 63 mg/dL (ref 0–99)
NonHDL: 87.82
Total CHOL/HDL Ratio: 3
Triglycerides: 126 mg/dL (ref 0.0–149.0)
VLDL: 25.2 mg/dL (ref 0.0–40.0)

## 2021-06-13 LAB — CBC WITH DIFFERENTIAL/PLATELET
Basophils Absolute: 0 10*3/uL (ref 0.0–0.1)
Basophils Relative: 0.5 % (ref 0.0–3.0)
Eosinophils Absolute: 0.2 10*3/uL (ref 0.0–0.7)
Eosinophils Relative: 2.3 % (ref 0.0–5.0)
HCT: 40.6 % (ref 36.0–46.0)
Hemoglobin: 13.7 g/dL (ref 12.0–15.0)
Lymphocytes Relative: 33.8 % (ref 12.0–46.0)
Lymphs Abs: 2.4 10*3/uL (ref 0.7–4.0)
MCHC: 33.8 g/dL (ref 30.0–36.0)
MCV: 88.8 fl (ref 78.0–100.0)
Monocytes Absolute: 0.3 10*3/uL (ref 0.1–1.0)
Monocytes Relative: 4.9 % (ref 3.0–12.0)
Neutro Abs: 4.1 10*3/uL (ref 1.4–7.7)
Neutrophils Relative %: 58.5 % (ref 43.0–77.0)
Platelets: 185 10*3/uL (ref 150.0–400.0)
RBC: 4.58 Mil/uL (ref 3.87–5.11)
RDW: 13.3 % (ref 11.5–15.5)
WBC: 7.1 10*3/uL (ref 4.0–10.5)

## 2021-06-13 LAB — URINALYSIS, ROUTINE W REFLEX MICROSCOPIC
Bilirubin Urine: NEGATIVE
Ketones, ur: NEGATIVE
Nitrite: POSITIVE — AB
RBC / HPF: NONE SEEN (ref 0–?)
Specific Gravity, Urine: 1.015 (ref 1.000–1.030)
Total Protein, Urine: NEGATIVE
Urine Glucose: NEGATIVE
Urobilinogen, UA: 0.2 (ref 0.0–1.0)
pH: 7.5 (ref 5.0–8.0)

## 2021-06-13 LAB — TSH: TSH: 4.23 u[IU]/mL (ref 0.35–5.50)

## 2021-06-13 LAB — COMPREHENSIVE METABOLIC PANEL
ALT: 12 U/L (ref 0–35)
AST: 14 U/L (ref 0–37)
Albumin: 4.2 g/dL (ref 3.5–5.2)
Alkaline Phosphatase: 66 U/L (ref 39–117)
BUN: 12 mg/dL (ref 6–23)
CO2: 27 mEq/L (ref 19–32)
Calcium: 9 mg/dL (ref 8.4–10.5)
Chloride: 104 mEq/L (ref 96–112)
Creatinine, Ser: 0.66 mg/dL (ref 0.40–1.20)
GFR: 94.33 mL/min (ref >60.00)
Glucose, Bld: 97 mg/dL (ref 70–99)
Potassium: 4 mEq/L (ref 3.5–5.1)
Sodium: 139 mEq/L (ref 135–145)
Total Bilirubin: 0.8 mg/dL (ref 0.2–1.2)
Total Protein: 7.1 g/dL (ref 6.0–8.3)

## 2021-06-13 MED ORDER — ATORVASTATIN CALCIUM 80 MG PO TABS: 80.0000 mg | ORAL_TABLET | Freq: Every day | ORAL | 3 refills | Status: DC

## 2021-06-13 MED ORDER — ALPRAZOLAM 0.25 MG PO TABS: ORAL_TABLET | ORAL | 2 refills | Status: DC

## 2021-06-13 NOTE — Assessment & Plan Note (Signed)
Long-term smoker

## 2021-06-13 NOTE — Progress Notes (Signed)
Subjective:  Patient ID: REIANNA BATDORF, female    DOB: [birth year]  Age: 60 y.o. MRN: [id number]  CC: Annual Exam   HPI MATIA ZELADA presents for a well exam C/o leg pains  Outpatient Medications Prior to Visit  Medication Sig Dispense Refill   ALPRAZolam (XANAX) 0.25 MG tablet TAKE (1) TABLET BY MOUTH TWICE DAILY AS NEEDED FOR ANXIETY 30 tablet 2   atorvastatin (LIPITOR) 80 MG tablet Take 1 tablet (80 mg total) by mouth daily. 90 tablet 3   Cholecalciferol (VITAMIN D3) 50 MCG (2000 UT) capsule Take 1 capsule (2,000 Units total) by mouth daily. 100 capsule 3   Multiple Vitamin (MULTIVITAMIN) tablet Take 1 tablet by mouth daily.     cefdinir (OMNICEF) 300 MG capsule Take 1 capsule (300 mg total) by mouth 2 (two) times daily. 20 capsule 0   vitamin C (ASCORBIC ACID) 500 MG tablet Take 500 mg by mouth daily.     No facility-administered medications prior to visit.    ROS: Review of Systems  Constitutional:  Negative for activity change, appetite change, chills, fatigue and unexpected weight change.  HENT:  Negative for congestion, mouth sores and sinus pressure.   Eyes:  Negative for visual disturbance.  Respiratory:  Negative for cough and chest tightness.   Gastrointestinal:  Negative for abdominal pain and nausea.  Genitourinary:  Negative for difficulty urinating, frequency and vaginal pain.  Musculoskeletal:  Positive for arthralgias and myalgias. Negative for back pain and gait problem.  Skin:  Negative for pallor and rash.  Neurological:  Negative for dizziness, tremors, weakness, numbness and headaches.  Psychiatric/Behavioral:  Negative for confusion and sleep disturbance.    Objective:  BP (!) 146/80 (BP Location: Left Arm)   Pulse 94   Temp 98.1 F (36.7 C) (Oral)   Ht 5\' 5"  (1.651 m)   Wt 148 lb 6.4 oz (67.3 kg)   SpO2 98%   BMI 24.70 kg/m   BP Readings from Last 3 Encounters:  06/13/21 (!) 146/80  12/05/20 (!) 142/78  06/20/20 140/70    Wt  Readings from Last 3 Encounters:  06/13/21 148 lb 6.4 oz (67.3 kg)  12/05/20 152 lb (68.9 kg)  06/20/20 146 lb 12.8 oz (66.6 kg)    Physical Exam Constitutional:      General: She is not in acute distress.    Appearance: She is well-developed.  HENT:     Head: Normocephalic.     Right Ear: External ear normal.     Left Ear: External ear normal.     Nose: Nose normal.  Eyes:     General:        Right eye: No discharge.        Left eye: No discharge.     Conjunctiva/sclera: Conjunctivae normal.     Pupils: Pupils are equal, round, and reactive to light.  Neck:     Thyroid: No thyromegaly.     Vascular: No JVD.     Trachea: No tracheal deviation.  Cardiovascular:     Rate and Rhythm: Normal rate and regular rhythm.     Heart sounds: Normal heart sounds.  Pulmonary:     Effort: No respiratory distress.     Breath sounds: No stridor. No wheezing.  Abdominal:     General: Bowel sounds are normal. There is no distension.     Palpations: Abdomen is soft. There is no mass.     Tenderness: There is no abdominal tenderness. There is no  guarding or rebound.  Musculoskeletal:        General: No tenderness.     Cervical back: Normal range of motion and neck supple. No rigidity.  Lymphadenopathy:     Cervical: No cervical adenopathy.  Skin:    Findings: No erythema or rash.  Neurological:     Cranial Nerves: No cranial nerve deficit.     Motor: No abnormal muscle tone.     Coordination: Coordination normal.     Deep Tendon Reflexes: Reflexes normal.  Psychiatric:        Behavior: Behavior normal.        Thought Content: Thought content normal.        Judgment: Judgment normal.    Lab Results  Component Value Date   WBC 7.2 12/03/2019   HGB 14.5 12/03/2019   HCT 43.2 12/03/2019   PLT 203.0 12/03/2019   GLUCOSE 102 (H) 12/05/2020   CHOL 129 12/05/2020   TRIG 148.0 12/05/2020   HDL 39.90 12/05/2020   LDLDIRECT 177.3 02/01/2012   LDLCALC 60 12/05/2020   ALT 16 12/05/2020    AST 16 12/05/2020   NA 140 12/05/2020   K 4.2 12/05/2020   CL 104 12/05/2020   CREATININE 0.65 12/05/2020   BUN 13 12/05/2020   CO2 27 12/05/2020   TSH 3.01 12/05/2020    VAS US CAROTID  Result Date: 07/13/2020 Carotid Arterial Duplex Study Indications:  Right bruit.               Patient denies cerebrovascular symptoms at this time. Risk Factors: Hyperlipidemia, current smoker, coronary artery disease. Performing Technologist: Mariane Masters RVT  Examination Guidelines: A complete evaluation includes B-mode imaging, spectral Doppler, color Doppler, and power Doppler as needed of all accessible portions of each vessel. Bilateral testing is considered an integral part of a complete examination. Limited examinations for reoccurring indications may be performed as noted.  Right Carotid Findings: +----------+--------+--------+--------+------------------+--------+           PSV cm/sEDV cm/sStenosisPlaque DescriptionComments +----------+--------+--------+--------+------------------+--------+ CCA Prox  1       0                                          +----------+--------+--------+--------+------------------+--------+ CCA Distal1       0                                          +----------+--------+--------+--------+------------------+--------+ ICA Prox  1       0       Normal                             +----------+--------+--------+--------+------------------+--------+ ICA Mid   1       0                                          +----------+--------+--------+--------+------------------+--------+ ICA Distal1       0                                          +----------+--------+--------+--------+------------------+--------+  ECA       1       0                                          +----------+--------+--------+--------+------------------+--------+ +----------+--------+-------+----------------+-------------------+           PSV cm/sEDV cmsDescribe         Arm Pressure (mmHG) +----------+--------+-------+----------------+-------------------+ Jarrett Ables              Multiphasic, [id number]                 +----------+--------+-------+----------------+-------------------+ +---------+--------+-+--------+-+---------+ VertebralPSV cm/s0EDV cm/s0Antegrade +---------+--------+-+--------+-+---------+  Left Carotid Findings: +----------+--------+--------+--------+------------------+------------------+           PSV cm/sEDV cm/sStenosisPlaque DescriptionComments           +----------+--------+--------+--------+------------------+------------------+ CCA Prox  1       0                                                    +----------+--------+--------+--------+------------------+------------------+ CCA Distal1       0                                 intimal thickening +----------+--------+--------+--------+------------------+------------------+ ICA Prox  1       0               heterogenous                         +----------+--------+--------+--------+------------------+------------------+ ICA Mid   1       0       1-39%                                        +----------+--------+--------+--------+------------------+------------------+ ICA Distal1       0                                                    +----------+--------+--------+--------+------------------+------------------+ ECA       3       0       >50%                                         +----------+--------+--------+--------+------------------+------------------+ +----------+--------+--------+----------------+-------------------+           PSV cm/sEDV cm/sDescribe        Arm Pressure (mmHG) +----------+--------+--------+----------------+-------------------+ Mathis Fare               Multiphasic, [id number]                 +----------+--------+--------+----------------+-------------------+ +---------+--------+-+--------+-+---------+  VertebralPSV cm/s1EDV cm/s0Antegrade +---------+--------+-+--------+-+---------+   Summary: Right Carotid: There is no evidence of stenosis in the right ICA. The                extracranial vessels were near-normal with only minimal wall  thickening or plaque. Left Carotid: Velocities in the left ICA are consistent with a 1-39% stenosis.               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 Vertebrals:  Bilateral vertebral arteries demonstrate antegrade flow. Subclavians: Normal flow hemodynamics were seen in bilateral subclavian              arteries. *See table(s) above for measurements and observations.  Electronically signed by Kathlyn Sacramento MD on 07/13/2020 at 4:23:33 PM.    Final     Assessment & Plan:      Walker Kehr, MD

## 2021-06-13 NOTE — Addendum Note (Signed)
Addended by: Jacobo Forest on: 06/13/2021 08:33 AM   Modules accepted: Orders

## 2021-06-13 NOTE — Patient Instructions (Signed)
Get a tDAP and Prevnar 20

## 2021-06-13 NOTE — Assessment & Plan Note (Addendum)
  We discussed age appropriate health related issues, including available/recomended screening tests and vaccinations. Labs were ordered to be later reviewed . All questions were answered. We discussed one or more of the following - seat belt use, use of sunscreen/sun exposure exercise, fall risk reduction, second hand smoke exposure, firearm use and storage, seat belt use, a need for adhering to healthy diet and exercise. Labs were ordered.  All questions were answered. Smoking discussed Advised to get a tDAP and Prevnar 20

## 2021-06-18 ENCOUNTER — Other Ambulatory Visit: Payer: Self-pay | Admitting: Internal Medicine

## 2021-06-18 MED ORDER — CEFUROXIME AXETIL 250 MG PO TABS: 250.0000 mg | ORAL_TABLET | Freq: Two times a day (BID) | ORAL | 0 refills | Status: DC

## 2021-07-21 ENCOUNTER — Encounter: Payer: Self-pay | Admitting: Internal Medicine

## 2021-10-03 ENCOUNTER — Telehealth: Payer: Self-pay | Admitting: Internal Medicine

## 2021-10-03 DIAGNOSIS — R109 Unspecified abdominal pain: Secondary | ICD-10-CM

## 2021-10-03 NOTE — Telephone Encounter (Signed)
Patient calling in  Patient says she is experiencing pain & tenderness on her left side.Marland Kitchen also puffy in stomach area & she thinks she has a kidney infection  Wants to know if provider will put in lab order for urine culture  Please cb [phone]

## 2021-10-04 NOTE — Telephone Encounter (Signed)
Okay urinalysis. Please see any provider today or tomorrow.  Thanks

## 2021-10-10 ENCOUNTER — Encounter: Payer: Self-pay | Admitting: Internal Medicine

## 2021-11-13 ENCOUNTER — Telehealth: Payer: Self-pay | Admitting: Internal Medicine

## 2021-11-13 LAB — HM MAMMOGRAPHY

## 2021-11-13 NOTE — Telephone Encounter (Signed)
Sheila Harrington called and states she faxed over order for diagnostic breast imaging. Please send back immediately as pt has appt this morning.     Fax #[phone]  Attention: Maudie Mercury

## 2021-11-13 NOTE — Telephone Encounter (Signed)
As able to give Sheila Harrington a verbal OK until we receive the fax for the provider to sign off on.

## 2021-11-16 NOTE — Telephone Encounter (Signed)
Verbal order confirmation form faxed to [facility] on 11/16/2021 ?

## 2021-11-20 ENCOUNTER — Other Ambulatory Visit: Payer: Self-pay

## 2021-11-20 ENCOUNTER — Ambulatory Visit (AMBULATORY_SURGERY_CENTER): Payer: No Typology Code available for payment source | Admitting: *Deleted

## 2021-11-20 VITALS — Ht 65.0 in | Wt 148.0 lb

## 2021-11-20 DIAGNOSIS — Z86010 Personal history of colonic polyps: Secondary | ICD-10-CM

## 2021-11-20 NOTE — Progress Notes (Signed)

## 2021-11-23 ENCOUNTER — Encounter: Payer: Self-pay | Admitting: Internal Medicine

## 2021-11-23 ENCOUNTER — Other Ambulatory Visit: Payer: Self-pay

## 2021-12-04 ENCOUNTER — Ambulatory Visit (AMBULATORY_SURGERY_CENTER): Payer: No Typology Code available for payment source | Admitting: Internal Medicine

## 2021-12-04 ENCOUNTER — Other Ambulatory Visit: Payer: Self-pay | Admitting: Internal Medicine

## 2021-12-04 ENCOUNTER — Encounter: Payer: Self-pay | Admitting: Internal Medicine

## 2021-12-04 ENCOUNTER — Other Ambulatory Visit: Payer: Self-pay

## 2021-12-04 VITALS — BP 142/69 | HR 77 | Temp 96.6°F | Resp 13 | Ht 65.0 in | Wt 148.0 lb

## 2021-12-04 DIAGNOSIS — K635 Polyp of colon: Secondary | ICD-10-CM | POA: Diagnosis not present

## 2021-12-04 DIAGNOSIS — D125 Benign neoplasm of sigmoid colon: Secondary | ICD-10-CM

## 2021-12-04 DIAGNOSIS — Z860100 Personal history of colon polyps, unspecified: Secondary | ICD-10-CM

## 2021-12-04 DIAGNOSIS — D128 Benign neoplasm of rectum: Secondary | ICD-10-CM

## 2021-12-04 DIAGNOSIS — D123 Benign neoplasm of transverse colon: Secondary | ICD-10-CM

## 2021-12-04 DIAGNOSIS — D124 Benign neoplasm of descending colon: Secondary | ICD-10-CM

## 2021-12-04 HISTORY — PX: COLONOSCOPY: SHX174

## 2021-12-04 MED ORDER — SODIUM CHLORIDE 0.9 % IV SOLN: 500.0000 mL | Freq: Once | INTRAVENOUS | Status: DC

## 2021-12-04 NOTE — Progress Notes (Signed)
Pt's states no medical or surgical changes since previsit or office visit.  VS CW  

## 2021-12-04 NOTE — Progress Notes (Signed)
Called to room to assist during endoscopic procedure.  Patient ID and intended procedure confirmed with present staff. Received instructions for my participation in the procedure from the performing physician.  

## 2021-12-04 NOTE — Progress Notes (Signed)
Report to PACU, RN, vss, BBS= Clear.  

## 2021-12-04 NOTE — Progress Notes (Signed)
[facility] History and Physical ? ? ?Primary Care Physician:  Cassandria Anger, MD ? ? ?Reason for Procedure:   Hx colon polyp ? ?Plan:    colonoscopy ? ? ? ? ?HPI: Sheila Harrington is a 61 y.o. female s/p romval of 1 cm sigmoid adenoma 2014 w/ f/u colonoscopy 2017 - no polyps. ? ? ?Past Medical History:  ?Diagnosis Date  ? Anxiety   ? Atypical mole 11/22/2014  ? mild- right abdomen  ? Chronic UTI   ? Hyperlipidemia   ? Personal history of colonic adenoma 11/20/2012  ? 11/2012 - 1 cm sigmoid adenoma  ? Squamous cell carcinoma of skin 03/07/2020  ? neck, anterior, keratoacanthoma  ? Varicella   ? ? ?Past Surgical History:  ?Procedure Laterality Date  ? COLONOSCOPY  12/04/2021  ? COLONOSCOPY W/ POLYPECTOMY    ? COLONOSCOPY WITH PROPOFOL  03/28/2016  ? Dr.Zaahir Pickney  ? G2P2    ? TUBAL LIGATION    ? WISDOM TOOTH EXTRACTION    ? ? ?Prior to Admission medications   ?Medication Sig Start Date End Date Taking? Authorizing Provider  ?acetaminophen (TYLENOL) 325 MG tablet Take 650 mg by mouth every 6 (six) hours as needed.   Yes [provider]  ?ALPRAZolam (XANAX) 0.25 MG tablet TAKE (1) TABLET BY MOUTH TWICE DAILY AS NEEDED FOR ANXIETY 06/13/21  Yes Plotnikov, Evie Lacks, MD  ?Ascorbic Acid (VITAMIN C PO) Take by mouth.   Yes [provider]  ?Aspirin 81 MG CAPS Take 1 capsule by mouth daily at 6 PM.   Yes [provider]  ?atorvastatin (LIPITOR) 80 MG tablet Take 1 tablet (80 mg total) by mouth daily. 06/13/21 12/04/21 Yes Plotnikov, Evie Lacks, MD  ?Cholecalciferol (VITAMIN D3) 50 MCG (2000 UT) capsule Take 1 capsule (2,000 Units total) by mouth daily. 12/05/20  Yes Plotnikov, Evie Lacks, MD  ?Cyanocobalamin (VITAMIN B-12 PO) Take by mouth.   Yes [provider]  ?zinc gluconate 50 MG tablet Take 50 mg by mouth daily.   Yes [provider]  ? ? ?Current Outpatient Medications  ?Medication Sig Dispense Refill  ? acetaminophen (TYLENOL) 325 MG tablet Take 650 mg by mouth  every 6 (six) hours as needed.    ? ALPRAZolam (XANAX) 0.25 MG tablet TAKE (1) TABLET BY MOUTH TWICE DAILY AS NEEDED FOR ANXIETY 30 tablet 2  ? Ascorbic Acid (VITAMIN C PO) Take by mouth.    ? Aspirin 81 MG CAPS Take 1 capsule by mouth daily at 6 PM.    ? atorvastatin (LIPITOR) 80 MG tablet Take 1 tablet (80 mg total) by mouth daily. 90 tablet 3  ? Cholecalciferol (VITAMIN D3) 50 MCG (2000 UT) capsule Take 1 capsule (2,000 Units total) by mouth daily. 100 capsule 3  ? Cyanocobalamin (VITAMIN B-12 PO) Take by mouth.    ? zinc gluconate 50 MG tablet Take 50 mg by mouth daily.    ? ?Current Facility-Administered Medications  ?Medication Dose Route Frequency Provider Last Rate Last Admin  ? 0.9 %  sodium chloride infusion  500 mL Intravenous Once Gatha Mayer, MD      ? ? ?Allergies as of 12/04/2021 - Review Complete 12/04/2021  ?Allergen Reaction Noted  ? Zestril [lisinopril]  06/06/2020  ? ? ?Family History  ?Problem Relation Age of Onset  ? Cancer Mother   ?     breast; thyroid  ? Stroke Mother   ? Hyperlipidemia Mother   ? Cancer Maternal Grandmother   ?  Diabetes Neg Hx   ? Heart disease Neg Hx   ? Colon cancer Neg Hx   ? ? ?Social History  ? ?Socioeconomic History  ? Marital status: Married  ?  Spouse name: Not on file  ? Number of children: 2  ? Years of education: 64  ? Highest education level: Not on file  ?Occupational History  ? Occupation: Insurance account manager  ?Tobacco Use  ? Smoking status: Every Day  ?  Packs/day: 0.50  ?  Years: 10.00  ?  Pack years: 5.00  ?  Types: Cigarettes  ? Smokeless tobacco: Never  ? Tobacco comments:  ?  advised taht it is bad  ?Vaping Use  ? Vaping Use: Never used  ?Substance and Sexual Activity  ? Alcohol use: Never  ? Drug use: Never  ? Sexual activity: Yes  ?  Partners: Male  ?Other Topics Concern  ? Not on file  ?Social History Narrative  ? [employer], on the job training. Married - 1986 - 2 dtrs' 89, '93. 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 Marriage is in good  health  ? ?Social Determinants of Health  ? ?Financial Resource Strain: Not on file  ?Food Insecurity: Not on file  ?Transportation Needs: Not on file  ?Physical Activity: Not on file  ?Stress: Not on file  ?Social Connections: Not on file  ?Intimate Partner Violence: Not on file  ? ? ?Review of Systems: ? ?All other review of systems negative except as mentioned in the HPI. ? ?Physical Exam: ?Vital signs ?BP (!) 158/90   Pulse 92   Temp (!) 96.6 ?F (35.9 ?C) (Temporal)   Ht '5\' 5"'$  (1.651 m)   Wt 148 lb (67.1 kg)   SpO2 99%   BMI 24.63 kg/m?  ? ?General:   Alert,  Well-developed, well-nourished, pleasant and cooperative in NAD ?Lungs:  Clear throughout to auscultation.   ?Heart:  Regular rate and rhythm; no murmurs, clicks, rubs,  or gallops. ?Abdomen:  Soft, nontender and nondistended. Normal bowel sounds.   ?Neuro/Psych:  Alert and cooperative. Normal mood and affect. A and O x 3 ? ? ?'@Talissa Apple'$  Simonne Maffucci, MD, [facility] Regal ?[facility] ?[phone] (pager) ?12/04/2021 9:06 AM@ ? ?

## 2021-12-04 NOTE — Op Note (Signed)
[place] ?Patient Name: Sheila Harrington ?Procedure Date: 12/04/2021 9:06 AM ?MRN: [id number] ?Endoscopist: Gatha Mayer , MD ?Age: 61 ?Referring MD:  ?Date of Birth: 01-01-1961 ?Gender: Female ?Account #: [id number] ?Procedure:                Colonoscopy ?Indications:              Surveillance: Personal history of adenomatous  ?                          polyps on last colonoscopy > 5 years ago, Last  ?                          colonoscopy: 2017 ?Medicines:                Propofol per Anesthesia, Monitored Anesthesia Care ?Procedure:                Pre-Anesthesia Assessment: ?                          - Prior to the procedure, a History and Physical  ?                          was performed, and patient medications and  ?                          allergies were reviewed. The patient's tolerance of  ?                          previous anesthesia was also reviewed. The risks  ?                          and benefits of the procedure and the sedation  ?                          options and risks were discussed with the patient.  ?                          All questions were answered, and informed consent  ?                          was obtained. Prior Anticoagulants: The patient has  ?                          taken no previous anticoagulant or antiplatelet  ?                          agents. ASA Grade Assessment: II - A patient with  ?                          mild systemic disease. After reviewing the risks  ?                          and benefits, the patient was deemed in  ?  satisfactory condition to undergo the procedure. ?                          After obtaining informed consent, the colonoscope  ?                          was passed under direct vision. Throughout the  ?                          procedure, the patient's blood pressure, pulse, and  ?                          oxygen saturations were monitored continuously. The  ?                          Olympus PCF-H190DL  ([id number]) Colonoscope was  ?                          introduced through the anus and advanced to the the  ?                          cecum, identified by appendiceal orifice and  ?                          ileocecal valve. The colonoscopy was performed  ?                          without difficulty. The patient tolerated the  ?                          procedure well. The quality of the bowel  ?                          preparation was good. The ileocecal valve,  ?                          appendiceal orifice, and rectum were photographed.  ?                          The bowel preparation used was Miralax via split  ?                          dose instruction. ?Scope In: 9:13:12 AM ?Scope Out: 9:30:38 AM ?Scope Withdrawal Time: 0 hours 12 minutes 54 seconds  ?Total Procedure Duration: 0 hours 17 minutes 26 seconds  ?Findings:                 The perianal and digital rectal examinations were  ?                          normal. ?                          Two sessile polyps were found in the sigmoid colon.  ?  The polyps were diminutive in size. These polyps  ?                          were removed with a cold snare. Resection and  ?                          retrieval were complete. Verification of patient  ?                          identification for the specimen was done. Estimated  ?                          blood loss was minimal. ?                          Many small and large-mouthed diverticula were found  ?                          in the entire colon. ?                          The exam was otherwise without abnormality on  ?                          direct and retroflexion views. ?Complications:            No immediate complications. ?Estimated Blood Loss:     Estimated blood loss was minimal. ?Impression:               - Two diminutive polyps in the sigmoid colon,  ?                          removed with a cold snare. Resected and retrieved. ?                          - Severe  diverticulosis in the entire examined  ?                          colon. ?                          - The examination was otherwise normal on direct  ?                          and retroflexion views. ?                          - Personal history of colonic polyp 10 mm adenoma  ?                          2014, no polyps 2017. ?Recommendation:           - Patient has a contact number available for  ?                          emergencies. The signs and symptoms of potential  ?  delayed complications were discussed with the  ?                          patient. Return to normal activities tomorrow.  ?                          Written discharge instructions were provided to the  ?                          patient. ?                          - Resume previous diet. ?                          - Continue present medications. ?                          - Await pathology results. ?                          - Repeat colonoscopy is recommended for  ?                          surveillance. The colonoscopy date will be  ?                          determined after pathology results from today's  ?                          exam become available for review. ?Gatha Mayer, MD ?12/04/2021 9:37:58 AM ?This report has been signed electronically. ?

## 2021-12-04 NOTE — Patient Instructions (Addendum)
I removed 2 tiny polyps today. ?Saw diverticulosis again. ? ?I will let you know pathology results and when to have another routine colonoscopy by mail and/or My Chart. ? ?Best wishes for a happy and healthy retirement and being a grandparent! ? ?I appreciate the opportunity to care for you. ?Gatha Mayer, MD, [facility] ? ? ?Information on diverticulosis and polyps given to you. ? ?Await pathology results. ? ?Resume previous diet and medications. ? ? ?YOU HAD AN ENDOSCOPIC PROCEDURE TODAY AT [facility]:   Refer to the procedure report that was given to you for any specific questions about what was found during the examination.  If the procedure report does not answer your questions, please call your gastroenterologist to clarify.  If you requested that your care partner not be given the details of your procedure findings, then the procedure report has been included in a sealed envelope for you to review at your convenience later. ? ?YOU SHOULD EXPECT: Some feelings of bloating in the abdomen. Passage of more gas than usual.  Walking can help get rid of the air that was put into your GI tract during the procedure and reduce the bloating. If you had a lower endoscopy (such as a colonoscopy or flexible sigmoidoscopy) you may notice spotting of blood in your stool or on the toilet paper. If you underwent a bowel prep for your procedure, you may not have a normal bowel movement for a few days. ? ?Please Note:  You might notice some irritation and congestion in your nose or some drainage.  This is from the oxygen used during your procedure.  There is no need for concern and it should clear up in a day or so. ? ?SYMPTOMS TO REPORT IMMEDIATELY: ? ?Following lower endoscopy (colonoscopy or flexible sigmoidoscopy): ? Excessive amounts of blood in the stool ? Significant tenderness or worsening of abdominal pains ? Swelling of the abdomen that is new, acute ? Fever of 100?F or higher ? ? ?For urgent or emergent  issues, a gastroenterologist can be reached at any hour by calling [phone]. ?Do not use MyChart messaging for urgent concerns.  ? ? ?DIET:  We do recommend a small meal at first, but then you may proceed to your regular diet.  Drink plenty of fluids but you should avoid alcoholic beverages for 24 hours. ? ?ACTIVITY:  You should plan to take it easy for the rest of today and you should NOT DRIVE or use heavy machinery until tomorrow (because of the sedation medicines used during the test).   ? ?FOLLOW UP: ?Our staff will call the number listed on your records 48-72 hours following your procedure to check on you and address any questions or concerns that you may have regarding the information given to you following your procedure. If we do not reach you, we will leave a message.  We will attempt to reach you two times.  During this call, we will ask if you have developed any symptoms of COVID 19. If you develop any symptoms (ie: fever, flu-like symptoms, shortness of breath, cough etc.) before then, please call [phone].  If you test positive for Covid 19 in the 2 weeks post procedure, please call and report this information to [place].   ? ?If any biopsies were taken you will be contacted by phone or by letter within the next 1-3 weeks.  Please call us at [phone] if you have not heard about the biopsies in 3 weeks.  ? ? ?  SIGNATURES/CONFIDENTIALITY: ?You and/or your care partner have signed paperwork which will be entered into your electronic medical record.  These signatures attest to the fact that that the information above on your After Visit Summary has been reviewed and is understood.  Full responsibility of the confidentiality of this discharge information lies with you and/or your care-partner.  ?

## 2021-12-06 ENCOUNTER — Telehealth: Payer: Self-pay

## 2021-12-06 NOTE — Telephone Encounter (Signed)
?  Follow up Call- ? ?Call back number 12/04/2021  ?Post procedure Call Back phone  # [phone]  ?Permission to leave phone message Yes  ?Some recent data might be hidden  ?  ? ?Patient questions: ? ?Do you have a fever, pain , or abdominal swelling? No. ?Pain Score  0 * ? ?Have you tolerated food without any problems? Yes.   ? ?Have you been able to return to your normal activities? Yes.   ? ?Do you have any questions about your discharge instructions: ?Diet   No. ?Medications  No. ?Follow up visit  No. ? ?Do you have questions or concerns about your Care? No. ? ?Actions: ?* If pain score is 4 or above: ?No action needed, pain <4. ? ?Have you developed a fever since your procedure? no ? ?2.   Have you had an respiratory symptoms (SOB or cough) since your procedure? no ? ?3.   Have you tested positive for COVID 19 since your procedure no ? ?4.   Have you had any family members/close contacts diagnosed with the COVID 19 since your procedure?  no ? ? ?If yes to any of these questions please route to Joylene John, RN and Joella Prince, RN  ? ? ?

## 2021-12-11 ENCOUNTER — Encounter: Payer: Self-pay | Admitting: Internal Medicine

## 2022-03-13 ENCOUNTER — Encounter: Payer: Self-pay | Admitting: Cardiology

## 2022-06-20 ENCOUNTER — Other Ambulatory Visit: Payer: Self-pay | Admitting: Internal Medicine

## 2022-07-03 DIAGNOSIS — H524 Presbyopia: Secondary | ICD-10-CM | POA: Insufficient documentation

## 2022-08-06 ENCOUNTER — Ambulatory Visit (INDEPENDENT_AMBULATORY_CARE_PROVIDER_SITE_OTHER): Payer: No Typology Code available for payment source | Admitting: Internal Medicine

## 2022-08-06 ENCOUNTER — Encounter: Payer: Self-pay | Admitting: Internal Medicine

## 2022-08-06 VITALS — BP 124/76 | HR 79 | Temp 98.2°F | Ht 65.0 in | Wt 147.0 lb

## 2022-08-06 DIAGNOSIS — Z23 Encounter for immunization: Secondary | ICD-10-CM | POA: Diagnosis not present

## 2022-08-06 DIAGNOSIS — E785 Hyperlipidemia, unspecified: Secondary | ICD-10-CM

## 2022-08-06 DIAGNOSIS — Z0000 Encounter for general adult medical examination without abnormal findings: Secondary | ICD-10-CM | POA: Diagnosis not present

## 2022-08-06 DIAGNOSIS — R109 Unspecified abdominal pain: Secondary | ICD-10-CM | POA: Diagnosis not present

## 2022-08-06 DIAGNOSIS — F4323 Adjustment disorder with mixed anxiety and depressed mood: Secondary | ICD-10-CM

## 2022-08-06 LAB — COMPREHENSIVE METABOLIC PANEL
ALT: 13 U/L (ref 0–35)
AST: 18 U/L (ref 0–37)
Albumin: 4.6 g/dL (ref 3.5–5.2)
Alkaline Phosphatase: 62 U/L (ref 39–117)
BUN: 11 mg/dL (ref 6–23)
CO2: 27 mEq/L (ref 19–32)
Calcium: 9.2 mg/dL (ref 8.4–10.5)
Chloride: 104 mEq/L (ref 96–112)
Creatinine, Ser: 0.65 mg/dL (ref 0.40–1.20)
GFR: 93.92 mL/min (ref >60.00)
Glucose, Bld: 92 mg/dL (ref 70–99)
Potassium: 3.9 mEq/L (ref 3.5–5.1)
Sodium: 139 mEq/L (ref 135–145)
Total Bilirubin: 0.8 mg/dL (ref 0.2–1.2)
Total Protein: 7.7 g/dL (ref 6.0–8.3)

## 2022-08-06 LAB — URINALYSIS
Bilirubin Urine: NEGATIVE
Hgb urine dipstick: NEGATIVE
Ketones, ur: NEGATIVE
Leukocytes,Ua: NEGATIVE
Nitrite: NEGATIVE
Specific Gravity, Urine: 1.01 (ref 1.000–1.030)
Total Protein, Urine: NEGATIVE
Urine Glucose: NEGATIVE
Urobilinogen, UA: 0.2 (ref 0.0–1.0)
pH: 7.5 (ref 5.0–8.0)

## 2022-08-06 LAB — CBC WITH DIFFERENTIAL/PLATELET
Basophils Absolute: 0 10*3/uL (ref 0.0–0.1)
Basophils Relative: 0.5 % (ref 0.0–3.0)
Eosinophils Absolute: 0.1 10*3/uL (ref 0.0–0.7)
Eosinophils Relative: 1.8 % (ref 0.0–5.0)
HCT: 44.5 % (ref 36.0–46.0)
Hemoglobin: 14.8 g/dL (ref 12.0–15.0)
Lymphocytes Relative: 33.7 % (ref 12.0–46.0)
Lymphs Abs: 2.6 10*3/uL (ref 0.7–4.0)
MCHC: 33.3 g/dL (ref 30.0–36.0)
MCV: 91.4 fl (ref 78.0–100.0)
Monocytes Absolute: 0.5 10*3/uL (ref 0.1–1.0)
Monocytes Relative: 5.9 % (ref 3.0–12.0)
Neutro Abs: 4.5 10*3/uL (ref 1.4–7.7)
Neutrophils Relative %: 58.1 % (ref 43.0–77.0)
Platelets: 211 10*3/uL (ref 150.0–400.0)
RBC: 4.88 Mil/uL (ref 3.87–5.11)
RDW: 13.8 % (ref 11.5–15.5)
WBC: 7.8 10*3/uL (ref 4.0–10.5)

## 2022-08-06 LAB — LIPID PANEL
Cholesterol: 160 mg/dL (ref 0–200)
HDL: 46.4 mg/dL (ref >39.00)
LDL Cholesterol: 88 mg/dL (ref 0–99)
NonHDL: 114.02
Total CHOL/HDL Ratio: 3
Triglycerides: 132 mg/dL (ref 0.0–149.0)
VLDL: 26.4 mg/dL (ref 0.0–40.0)

## 2022-08-06 LAB — TSH: TSH: 2.95 u[IU]/mL (ref 0.35–5.50)

## 2022-08-06 MED ORDER — ALPRAZOLAM 0.25 MG PO TABS: ORAL_TABLET | ORAL | 3 refills | Status: DC

## 2022-08-06 MED ORDER — ATORVASTATIN CALCIUM 80 MG PO TABS: 80.0000 mg | ORAL_TABLET | Freq: Every day | ORAL | 3 refills | Status: DC

## 2022-08-06 NOTE — Addendum Note (Signed)
Addended by: Basil Dess on: 08/06/2022 09:42 AM   Modules accepted: Orders

## 2022-08-06 NOTE — Assessment & Plan Note (Signed)
On Lipitor 

## 2022-08-06 NOTE — Assessment & Plan Note (Signed)

## 2022-08-06 NOTE — Patient Instructions (Signed)
RSV suggested

## 2022-08-06 NOTE — Progress Notes (Signed)
Subjective:  Patient ID: Sheila Harrington, female    DOB: [birth year]  Age: 61 y.o. MRN: [id number]  CC: Annual Exam   HPI Sheila Harrington presents for a well exam  Outpatient Medications Prior to Visit  Medication Sig Dispense Refill   acetaminophen (TYLENOL) 325 MG tablet Take 650 mg by mouth every 6 (six) hours as needed.     Ascorbic Acid (VITAMIN C PO) Take by mouth.     Aspirin 81 MG CAPS Take 1 capsule by mouth daily at 6 PM.     Cholecalciferol (VITAMIN D3) 50 MCG (2000 UT) capsule Take 1 capsule (2,000 Units total) by mouth daily. 100 capsule 3   Cyanocobalamin (VITAMIN B-12 PO) Take by mouth.     zinc gluconate 50 MG tablet Take 50 mg by mouth daily.     ALPRAZolam (XANAX) 0.25 MG tablet TAKE (1) TABLET BY MOUTH TWICE DAILY AS NEEDED FOR ANXIETY 30 tablet 2   atorvastatin (LIPITOR) 80 MG tablet Take 1 tablet (80 mg total) by mouth daily. Annual appt is due must see provider for future refills 30 tablet 0   No facility-administered medications prior to visit.    ROS: Review of Systems  Constitutional:  Negative for activity change, appetite change, chills, fatigue and unexpected weight change.  HENT:  Negative for congestion, mouth sores and sinus pressure.   Eyes:  Negative for visual disturbance.  Respiratory:  Negative for cough and chest tightness.   Gastrointestinal:  Negative for abdominal pain and nausea.  Genitourinary:  Negative for difficulty urinating, frequency and vaginal pain.  Musculoskeletal:  Negative for back pain and gait problem.  Skin:  Negative for pallor, rash and wound.  Neurological:  Negative for dizziness, tremors, weakness, numbness and headaches.  Psychiatric/Behavioral:  Negative for confusion and sleep disturbance.     Objective:  BP 124/76 (BP Location: Right Arm, Patient Position: Sitting, Cuff Size: Normal)   Pulse 79   Temp 98.2 F (36.8 C) (Oral)   Ht '5\' 5"'$  (1.651 m)   Wt 147 lb (66.7 kg)   SpO2 99%   BMI 24.46 kg/m    BP Readings from Last 3 Encounters:  08/06/22 124/76  12/04/21 (!) 142/69  06/13/21 (!) 146/80    Wt Readings from Last 3 Encounters:  08/06/22 147 lb (66.7 kg)  12/04/21 148 lb (67.1 kg)  11/20/21 148 lb (67.1 kg)    Physical Exam Constitutional:      General: She is not in acute distress.    Appearance: Normal appearance. She is well-developed.  HENT:     Head: Normocephalic.     Right Ear: External ear normal.     Left Ear: External ear normal.     Nose: Nose normal.  Eyes:     General:        Right eye: No discharge.        Left eye: No discharge.     Conjunctiva/sclera: Conjunctivae normal.     Pupils: Pupils are equal, round, and reactive to light.  Neck:     Thyroid: No thyromegaly.     Vascular: No JVD.     Trachea: No tracheal deviation.  Cardiovascular:     Rate and Rhythm: Normal rate and regular rhythm.     Heart sounds: Normal heart sounds.  Pulmonary:     Effort: No respiratory distress.     Breath sounds: No stridor. No wheezing.  Abdominal:     General: Bowel sounds are normal. There is  no distension.     Palpations: Abdomen is soft. There is no mass.     Tenderness: There is no abdominal tenderness. There is no guarding or rebound.  Musculoskeletal:        General: No tenderness.     Cervical back: Normal range of motion and neck supple. No rigidity.  Lymphadenopathy:     Cervical: No cervical adenopathy.  Skin:    Findings: No erythema or rash.  Neurological:     Mental Status: She is oriented to person, place, and time.     Cranial Nerves: No cranial nerve deficit.     Motor: No abnormal muscle tone.     Coordination: Coordination normal.     Deep Tendon Reflexes: Reflexes normal.  Psychiatric:        Behavior: Behavior normal.        Thought Content: Thought content normal.        Judgment: Judgment normal.     Lab Results  Component Value Date   WBC 7.1 06/13/2021   HGB 13.7 06/13/2021   HCT 40.6 06/13/2021   PLT 185.0  06/13/2021   GLUCOSE 97 06/13/2021   CHOL 123 06/13/2021   TRIG 126.0 06/13/2021   HDL 35.20 (L) 06/13/2021   LDLDIRECT 177.3 02/01/2012   LDLCALC 63 06/13/2021   ALT 12 06/13/2021   AST 14 06/13/2021   NA 139 06/13/2021   K 4.0 06/13/2021   CL 104 06/13/2021   CREATININE 0.66 06/13/2021   BUN 12 06/13/2021   CO2 27 06/13/2021   TSH 4.23 06/13/2021    VAS US CAROTID  Result Date: 07/13/2020 Carotid Arterial Duplex Study Indications:  Right bruit.               Patient denies cerebrovascular symptoms at this time. Risk Factors: Hyperlipidemia, current smoker, coronary artery disease. Performing Technologist: Mariane Masters RVT  Examination Guidelines: A complete evaluation includes B-mode imaging, spectral Doppler, color Doppler, and power Doppler as needed of all accessible portions of each vessel. Bilateral testing is considered an integral part of a complete examination. Limited examinations for reoccurring indications may be performed as noted.  Right Carotid Findings: +----------+--------+--------+--------+------------------+--------+           PSV cm/sEDV cm/sStenosisPlaque DescriptionComments +----------+--------+--------+--------+------------------+--------+ CCA Prox  1       0                                          +----------+--------+--------+--------+------------------+--------+ CCA Distal1       0                                          +----------+--------+--------+--------+------------------+--------+ ICA Prox  1       0       Normal                             +----------+--------+--------+--------+------------------+--------+ ICA Mid   1       0                                          +----------+--------+--------+--------+------------------+--------+ ICA Distal1  0                                          +----------+--------+--------+--------+------------------+--------+ ECA       1       0                                           +----------+--------+--------+--------+------------------+--------+ +----------+--------+-------+----------------+-------------------+           PSV cm/sEDV cmsDescribe        Arm Pressure (mmHG) +----------+--------+-------+----------------+-------------------+ Jarrett Ables              Multiphasic, [id number]                 +----------+--------+-------+----------------+-------------------+ +---------+--------+-+--------+-+---------+ VertebralPSV cm/s0EDV cm/s0Antegrade +---------+--------+-+--------+-+---------+  Left Carotid Findings: +----------+--------+--------+--------+------------------+------------------+           PSV cm/sEDV cm/sStenosisPlaque DescriptionComments           +----------+--------+--------+--------+------------------+------------------+ CCA Prox  1       0                                                    +----------+--------+--------+--------+------------------+------------------+ CCA Distal1       0                                 intimal thickening +----------+--------+--------+--------+------------------+------------------+ ICA Prox  1       0               heterogenous                         +----------+--------+--------+--------+------------------+------------------+ ICA Mid   1       0       1-39%                                        +----------+--------+--------+--------+------------------+------------------+ ICA Distal1       0                                                    +----------+--------+--------+--------+------------------+------------------+ ECA       3       0       >50%                                         +----------+--------+--------+--------+------------------+------------------+ +----------+--------+--------+----------------+-------------------+           PSV cm/sEDV cm/sDescribe        Arm Pressure (mmHG) +----------+--------+--------+----------------+-------------------+  Mathis Fare               Multiphasic, [id number]                 +----------+--------+--------+----------------+-------------------+ +---------+--------+-+--------+-+---------+ VertebralPSV cm/s1EDV cm/s0Antegrade +---------+--------+-+--------+-+---------+  Summary: Right Carotid: There is no evidence of stenosis in the right ICA. The                extracranial vessels were near-normal with only minimal wall                thickening or plaque. Left Carotid: Velocities in the left ICA are consistent with a 1-39% stenosis.               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 Vertebrals:  Bilateral vertebral arteries demonstrate antegrade flow. Subclavians: Normal flow hemodynamics were seen in bilateral subclavian              arteries. *See table(s) above for measurements and observations.  Electronically signed by Kathlyn Sacramento MD on 07/13/2020 at 4:23:33 PM.    Final     Assessment & Plan:   Problem List Items Addressed This Visit     Well adult exam - Primary     We discussed age appropriate health related issues, including available/recomended screening tests and vaccinations. Labs were ordered to be later reviewed . All questions were answered. We discussed one or more of the following - seat belt use, use of sunscreen/sun exposure exercise, fall risk reduction, second hand smoke exposure, firearm use and storage, seat belt use, a need for adhering to healthy diet and exercise. Labs were ordered.  All questions were answered.       Relevant Orders   TSH   Urinalysis   CBC with Differential/Platelet   Lipid panel   Comprehensive metabolic panel   Hyperlipidemia    On Lipitor      Relevant Medications   atorvastatin (LIPITOR) 80 MG tablet   Other Relevant Orders   Lipid panel   Comprehensive metabolic panel   Adjustment disorder with mixed anxiety and depressed mood    Xanax - rare use  Potential benefits of a long term benzodiazepines  use as well as potential risks  and  complications were explained to the patient and were aknowledged.      Relevant Orders   CBC with Differential/Platelet      Meds ordered this encounter  Medications   ALPRAZolam (XANAX) 0.25 MG tablet    Sig: TAKE (1) TABLET BY MOUTH TWICE DAILY AS NEEDED FOR ANXIETY    Dispense:  30 tablet    Refill:  3   atorvastatin (LIPITOR) 80 MG tablet    Sig: Take 1 tablet (80 mg total) by mouth daily. Annual appt is due must see provider for future refills    Dispense:  90 tablet    Refill:  3      Follow-up: Return in about 1 year (around 08/07/2023) for Wellness Exam.  Walker Kehr, MD

## 2022-08-06 NOTE — Assessment & Plan Note (Signed)
Xanax - rare use  Potential benefits of a long term benzodiazepines  use as well as potential risks  and complications were explained to the patient and were aknowledged.

## 2022-11-14 ENCOUNTER — Encounter: Payer: Self-pay | Admitting: Internal Medicine

## 2022-11-14 LAB — HM MAMMOGRAPHY

## 2023-03-12 ENCOUNTER — Other Ambulatory Visit: Payer: Self-pay | Admitting: Internal Medicine

## 2023-07-22 ENCOUNTER — Telehealth: Payer: Self-pay | Admitting: Internal Medicine

## 2023-07-22 DIAGNOSIS — R3915 Urgency of urination: Secondary | ICD-10-CM

## 2023-07-22 NOTE — Telephone Encounter (Signed)
Patient would like Dr. Posey Rea to put in an order for a urinalysis - she is having uti symptoms - per patient - please call patient and let her know - [phone]

## 2023-07-23 NOTE — Telephone Encounter (Signed)
Okay urinalysis.  Office visit with a provider if problems

## 2023-08-14 ENCOUNTER — Encounter: Payer: Self-pay | Admitting: Internal Medicine

## 2023-08-14 ENCOUNTER — Ambulatory Visit (INDEPENDENT_AMBULATORY_CARE_PROVIDER_SITE_OTHER): Payer: 59 | Admitting: Internal Medicine

## 2023-08-14 VITALS — BP 122/80 | HR 89 | Temp 98.6°F | Ht 65.0 in | Wt 142.0 lb

## 2023-08-14 DIAGNOSIS — R3915 Urgency of urination: Secondary | ICD-10-CM

## 2023-08-14 DIAGNOSIS — N390 Urinary tract infection, site not specified: Secondary | ICD-10-CM

## 2023-08-14 DIAGNOSIS — I2583 Coronary atherosclerosis due to lipid rich plaque: Secondary | ICD-10-CM

## 2023-08-14 DIAGNOSIS — E785 Hyperlipidemia, unspecified: Secondary | ICD-10-CM

## 2023-08-14 DIAGNOSIS — Z0000 Encounter for general adult medical examination without abnormal findings: Secondary | ICD-10-CM

## 2023-08-14 LAB — URINALYSIS, ROUTINE W REFLEX MICROSCOPIC
Bilirubin Urine: NEGATIVE
Hgb urine dipstick: NEGATIVE
Ketones, ur: NEGATIVE
Leukocytes,Ua: NEGATIVE
Nitrite: POSITIVE — AB
RBC / HPF: NONE SEEN (ref 0–?)
Specific Gravity, Urine: 1.015 (ref 1.000–1.030)
Total Protein, Urine: NEGATIVE
Urine Glucose: NEGATIVE
Urobilinogen, UA: 0.2 (ref 0.0–1.0)
pH: 7 (ref 5.0–8.0)

## 2023-08-14 LAB — LIPID PANEL
Cholesterol: 155 mg/dL (ref 0–200)
HDL: 38.2 mg/dL — ABNORMAL LOW (ref >39.00)
LDL Cholesterol: 91 mg/dL (ref 0–99)
NonHDL: 117.05
Total CHOL/HDL Ratio: 4
Triglycerides: 131 mg/dL (ref 0.0–149.0)
VLDL: 26.2 mg/dL (ref 0.0–40.0)

## 2023-08-14 LAB — CBC WITH DIFFERENTIAL/PLATELET
Basophils Absolute: 0 10*3/uL (ref 0.0–0.1)
Basophils Relative: 0.3 % (ref 0.0–3.0)
Eosinophils Absolute: 0.1 10*3/uL (ref 0.0–0.7)
Eosinophils Relative: 1.9 % (ref 0.0–5.0)
HCT: 43.1 % (ref 36.0–46.0)
Hemoglobin: 14.5 g/dL (ref 12.0–15.0)
Lymphocytes Relative: 33.1 % (ref 12.0–46.0)
Lymphs Abs: 2.2 10*3/uL (ref 0.7–4.0)
MCHC: 33.7 g/dL (ref 30.0–36.0)
MCV: 92 fL (ref 78.0–100.0)
Monocytes Absolute: 0.4 10*3/uL (ref 0.1–1.0)
Monocytes Relative: 5.8 % (ref 3.0–12.0)
Neutro Abs: 3.9 10*3/uL (ref 1.4–7.7)
Neutrophils Relative %: 58.9 % (ref 43.0–77.0)
Platelets: 193 10*3/uL (ref 150.0–400.0)
RBC: 4.69 Mil/uL (ref 3.87–5.11)
RDW: 13.3 % (ref 11.5–15.5)
WBC: 6.6 10*3/uL (ref 4.0–10.5)

## 2023-08-14 LAB — COMPREHENSIVE METABOLIC PANEL
ALT: 13 U/L (ref 0–35)
AST: 15 U/L (ref 0–37)
Albumin: 4.6 g/dL (ref 3.5–5.2)
Alkaline Phosphatase: 64 U/L (ref 39–117)
BUN: 13 mg/dL (ref 6–23)
CO2: 28 meq/L (ref 19–32)
Calcium: 9.3 mg/dL (ref 8.4–10.5)
Chloride: 104 meq/L (ref 96–112)
Creatinine, Ser: 0.71 mg/dL (ref 0.40–1.20)
GFR: 90.05 mL/min (ref >60.00)
Glucose, Bld: 98 mg/dL (ref 70–99)
Potassium: 4.2 meq/L (ref 3.5–5.1)
Sodium: 140 meq/L (ref 135–145)
Total Bilirubin: 0.9 mg/dL (ref 0.2–1.2)
Total Protein: 7.4 g/dL (ref 6.0–8.3)

## 2023-08-14 LAB — TSH: TSH: 3.44 u[IU]/mL (ref 0.35–5.50)

## 2023-08-14 MED ORDER — ATORVASTATIN CALCIUM 80 MG PO TABS: 80.0000 mg | ORAL_TABLET | Freq: Every day | ORAL | 3 refills | Status: DC

## 2023-08-14 MED ORDER — CEFUROXIME AXETIL 250 MG PO TABS: 250.0000 mg | ORAL_TABLET | Freq: Two times a day (BID) | ORAL | 0 refills | Status: AC

## 2023-08-14 NOTE — Assessment & Plan Note (Signed)
  We discussed age appropriate health related issues, including available/recomended screening tests and vaccinations. Labs were ordered to be later reviewed . All questions were answered. We discussed one or more of the following - seat belt use, use of sunscreen/sun exposure exercise, safe sex, fall risk reduction, second hand smoke exposure, firearm use and storage, seat belt use, a need for adhering to healthy diet and exercise. Labs were ordered or discussed if they are available. All questions were answered. Shingrix 2021 Cardiac CT score test offered 2020 Colon 2017 4/21 CT coronary calcium score of 471. This was 63 percentile for age and sex matched control. Simvastatin Aspirin Advised to get a tDAP and Prevnar 20

## 2023-08-14 NOTE — Assessment & Plan Note (Signed)
On Lipitor 

## 2023-08-14 NOTE — Assessment & Plan Note (Signed)
Recurrent UA and Cx. Ceftin po Renal US

## 2023-08-14 NOTE — Assessment & Plan Note (Signed)
On Lipitor, ASA No CP CT coronary calcium score of 471

## 2023-08-14 NOTE — Progress Notes (Signed)
Subjective:  Patient ID: Sheila Harrington, female    DOB: 1961/09/04  Age: 62 y.o. MRN: [id number]  CC: Annual Exam (Pt is wanting labs to check for diabetes due to recent results from eye doctor and check to see if kidney infection is still lingering.)   HPI Sheila Harrington presents for a well exam C/o UTI, pain on the left 07/22/2023 - Macrobid  Outpatient Medications Prior to Visit  Medication Sig Dispense Refill   acetaminophen (TYLENOL) 325 MG tablet Take 650 mg by mouth every 6 (six) hours as needed.     ALPRAZolam (XANAX) 0.25 MG tablet TAKE ONE TABLET TWICE DAILY AS NEEDED FOR ANXIETY 30 tablet 3   Ascorbic Acid (VITAMIN C PO) Take by mouth.     Aspirin 81 MG CAPS Take 1 capsule by mouth daily at 6 PM.     Cholecalciferol (VITAMIN D3) 50 MCG (2000 UT) capsule Take 1 capsule (2,000 Units total) by mouth daily. 100 capsule 3   Cyanocobalamin (VITAMIN B-12 PO) Take by mouth.     zinc gluconate 50 MG tablet Take 50 mg by mouth daily.     atorvastatin (LIPITOR) 80 MG tablet Take 1 tablet (80 mg total) by mouth daily. Annual appt is due must see provider for future refills 90 tablet 3   No facility-administered medications prior to visit.    ROS: Review of Systems  Constitutional:  Negative for activity change, appetite change, chills, fatigue and unexpected weight change.  HENT:  Negative for congestion, mouth sores and sinus pressure.   Eyes:  Negative for visual disturbance.  Respiratory:  Negative for cough and chest tightness.   Gastrointestinal:  Positive for abdominal pain. Negative for nausea.  Genitourinary:  Positive for dysuria and frequency. Negative for difficulty urinating and vaginal pain.  Musculoskeletal:  Negative for back pain and gait problem.  Skin:  Negative for pallor and rash.  Neurological:  Negative for dizziness, tremors, weakness, numbness and headaches.  Psychiatric/Behavioral:  Negative for confusion and sleep disturbance.     Objective:  BP  122/80 (BP Location: Left Arm, Patient Position: Sitting, Cuff Size: Normal)   Pulse 89   Temp 98.6 F (37 C) (Oral)   Ht 5\' 5"  (1.651 m)   Wt 142 lb (64.4 kg)   SpO2 95%   BMI 23.63 kg/m   BP Readings from Last 3 Encounters:  08/14/23 122/80  08/06/22 124/76  12/04/21 (!) 142/69    Wt Readings from Last 3 Encounters:  08/14/23 142 lb (64.4 kg)  08/06/22 147 lb (66.7 kg)  12/04/21 148 lb (67.1 kg)    Physical Exam Constitutional:      General: She is not in acute distress.    Appearance: Normal appearance. She is well-developed.  HENT:     Head: Normocephalic.     Right Ear: External ear normal.     Left Ear: External ear normal.     Nose: Nose normal.  Eyes:     General:        Right eye: No discharge.        Left eye: No discharge.     Conjunctiva/sclera: Conjunctivae normal.     Pupils: Pupils are equal, round, and reactive to light.  Neck:     Thyroid: No thyromegaly.     Vascular: No JVD.     Trachea: No tracheal deviation.  Cardiovascular:     Rate and Rhythm: Normal rate and regular rhythm.     Heart sounds: Normal  heart sounds.  Pulmonary:     Effort: No respiratory distress.     Breath sounds: No stridor. No wheezing.  Abdominal:     General: Bowel sounds are normal. There is no distension.     Palpations: Abdomen is soft. There is no mass.     Tenderness: There is no abdominal tenderness. There is no guarding or rebound.  Musculoskeletal:        General: No tenderness.     Cervical back: Normal range of motion and neck supple. No rigidity.  Lymphadenopathy:     Cervical: No cervical adenopathy.  Skin:    Findings: No erythema or rash.  Neurological:     Cranial Nerves: No cranial nerve deficit.     Motor: No abnormal muscle tone.     Coordination: Coordination normal.     Deep Tendon Reflexes: Reflexes normal.  Psychiatric:        Behavior: Behavior normal.        Thought Content: Thought content normal.        Judgment: Judgment normal.      Lab Results  Component Value Date   WBC 7.8 08/06/2022   HGB 14.8 08/06/2022   HCT 44.5 08/06/2022   PLT 211.0 08/06/2022   GLUCOSE 92 08/06/2022   CHOL 160 08/06/2022   TRIG 132.0 08/06/2022   HDL 46.40 08/06/2022   LDLDIRECT 177.3 02/01/2012   LDLCALC 88 08/06/2022   ALT 13 08/06/2022   AST 18 08/06/2022   NA 139 08/06/2022   K 3.9 08/06/2022   CL 104 08/06/2022   CREATININE 0.65 08/06/2022   BUN 11 08/06/2022   CO2 27 08/06/2022   TSH 2.95 08/06/2022    VAS US CAROTID  Result Date: 07/13/2020 Carotid Arterial Duplex Study Indications:  Right bruit.               Patient denies cerebrovascular symptoms at this time. Risk Factors: Hyperlipidemia, current smoker, coronary artery disease. Performing Technologist: Olegario Shearer RVT  Examination Guidelines: A complete evaluation includes B-mode imaging, spectral Doppler, color Doppler, and power Doppler as needed of all accessible portions of each vessel. Bilateral testing is considered an integral part of a complete examination. Limited examinations for reoccurring indications may be performed as noted.  Right Carotid Findings: +----------+--------+--------+--------+------------------+--------+           PSV cm/sEDV cm/sStenosisPlaque DescriptionComments +----------+--------+--------+--------+------------------+--------+ CCA Prox  1       0                                          +----------+--------+--------+--------+------------------+--------+ CCA Distal1       0                                          +----------+--------+--------+--------+------------------+--------+ ICA Prox  1       0       Normal                             +----------+--------+--------+--------+------------------+--------+ ICA Mid   1       0                                          +----------+--------+--------+--------+------------------+--------+  ICA Distal1       0                                           +----------+--------+--------+--------+------------------+--------+ ECA       1       0                                          +----------+--------+--------+--------+------------------+--------+ +----------+--------+-------+----------------+-------------------+           PSV cm/sEDV cmsDescribe        Arm Pressure (mmHG) +----------+--------+-------+----------------+-------------------+ Pleas Koch              Multiphasic, [id number]                 +----------+--------+-------+----------------+-------------------+ +---------+--------+-+--------+-+---------+ VertebralPSV cm/s0EDV cm/s0Antegrade +---------+--------+-+--------+-+---------+  Left Carotid Findings: +----------+--------+--------+--------+------------------+------------------+           PSV cm/sEDV cm/sStenosisPlaque DescriptionComments           +----------+--------+--------+--------+------------------+------------------+ CCA Prox  1       0                                                    +----------+--------+--------+--------+------------------+------------------+ CCA Distal1       0                                 intimal thickening +----------+--------+--------+--------+------------------+------------------+ ICA Prox  1       0               heterogenous                         +----------+--------+--------+--------+------------------+------------------+ ICA Mid   1       0       1-39%                                        +----------+--------+--------+--------+------------------+------------------+ ICA Distal1       0                                                    +----------+--------+--------+--------+------------------+------------------+ ECA       3       0       >50%                                         +----------+--------+--------+--------+------------------+------------------+ +----------+--------+--------+----------------+-------------------+           PSV  cm/sEDV cm/sDescribe        Arm Pressure (mmHG) +----------+--------+--------+----------------+-------------------+ Shann Medal               Multiphasic, [id number]                 +----------+--------+--------+----------------+-------------------+ +---------+--------+-+--------+-+---------+  VertebralPSV cm/s1EDV cm/s0Antegrade +---------+--------+-+--------+-+---------+   Summary: Right Carotid: There is no evidence of stenosis in the right ICA. The                extracranial vessels were near-normal with only minimal wall                thickening or plaque. Left Carotid: Velocities in the left ICA are consistent with a 1-39% stenosis.               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 Vertebrals:  Bilateral vertebral arteries demonstrate antegrade flow. Subclavians: Normal flow hemodynamics were seen in bilateral subclavian              arteries. *See table(s) above for measurements and observations.  Electronically signed by Lorine Bears MD on 07/13/2020 at 4:23:33 PM.    Final     Assessment & Plan:   Problem List Items Addressed This Visit     Well adult exam - Primary     We discussed age appropriate health related issues, including available/recomended screening tests and vaccinations. Labs were ordered to be later reviewed . All questions were answered. We discussed one or more of the following - seat belt use, use of sunscreen/sun exposure exercise, safe sex, fall risk reduction, second hand smoke exposure, firearm use and storage, seat belt use, a need for adhering to healthy diet and exercise. Labs were ordered or discussed if they are available. All questions were answered. Shingrix 2021 Cardiac CT score test offered 2020 Colon 2017 4/21 CT coronary calcium score of 471. This was 18 percentile for age and sex matched control. Simvastatin Aspirin Advised to get a tDAP and Prevnar 20      Relevant Orders   TSH   Urinalysis   CBC with Differential/Platelet   Lipid panel    Comprehensive metabolic panel   Hyperlipidemia    On Lipitor      Relevant Medications   atorvastatin (LIPITOR) 80 MG tablet   Coronary atherosclerosis    On Lipitor, ASA No CP CT coronary calcium score of 471      Relevant Medications   atorvastatin (LIPITOR) 80 MG tablet   UTI (urinary tract infection)    Recurrent UA and Cx. Ceftin po Renal US      Relevant Medications   cefUROXime (CEFTIN) 250 MG tablet   Other Relevant Orders   Urinalysis   CULTURE, URINE COMPREHENSIVE   US RENAL      Meds ordered this encounter  Medications   cefUROXime (CEFTIN) 250 MG tablet    Sig: Take 1 tablet (250 mg total) by mouth 2 (two) times daily with a meal for 10 days.    Dispense:  20 tablet    Refill:  0   atorvastatin (LIPITOR) 80 MG tablet    Sig: Take 1 tablet (80 mg total) by mouth daily. Annual appt is due must see provider for future refills    Dispense:  90 tablet    Refill:  3      Follow-up: Return in about 1 year (around 08/13/2024) for Wellness Exam.  Sonda Primes, MD

## 2023-08-16 LAB — CULTURE, URINE COMPREHENSIVE

## 2023-08-19 ENCOUNTER — Ambulatory Visit
Admission: RE | Admit: 2023-08-19 | Discharge: 2023-08-19 | Disposition: A | Discharge status: Home / Self Care | Payer: 59 | Source: Ambulatory Visit | Attending: Internal Medicine | Admitting: Internal Medicine

## 2023-08-19 DIAGNOSIS — N390 Urinary tract infection, site not specified: Secondary | ICD-10-CM

## 2023-10-10 ENCOUNTER — Other Ambulatory Visit: Payer: Self-pay | Admitting: Internal Medicine

## 2023-11-19 LAB — HM MAMMOGRAPHY

## 2024-07-14 ENCOUNTER — Telehealth: Payer: Self-pay

## 2024-07-14 DIAGNOSIS — Z0000 Encounter for general adult medical examination without abnormal findings: Secondary | ICD-10-CM

## 2024-07-14 DIAGNOSIS — E785 Hyperlipidemia, unspecified: Secondary | ICD-10-CM

## 2024-07-14 NOTE — Telephone Encounter (Signed)
 Spoke with the pt and was able to inform her that I was able to schedule her for the lab on 11/26 @ 8:00 am as the lab is not open on 11/28.

## 2024-07-14 NOTE — Telephone Encounter (Signed)
 Copied from CRM [id number]. Topic: Appointments - Scheduling Inquiry for Clinic >> Jul 14, 2024 11:06 AM Mesmerise C wrote: Reason for CRM: Patient has a physical scheduled on 12/3 inquiring if she can have her labs be done on 11/28 if they're opened if so

## 2024-08-12 ENCOUNTER — Other Ambulatory Visit

## 2024-08-12 DIAGNOSIS — E785 Hyperlipidemia, unspecified: Secondary | ICD-10-CM | POA: Diagnosis not present

## 2024-08-12 DIAGNOSIS — Z0000 Encounter for general adult medical examination without abnormal findings: Secondary | ICD-10-CM | POA: Diagnosis not present

## 2024-08-12 LAB — URINALYSIS, ROUTINE W REFLEX MICROSCOPIC
Bilirubin Urine: NEGATIVE
Hgb urine dipstick: NEGATIVE
Ketones, ur: NEGATIVE
Leukocytes,Ua: NEGATIVE
Nitrite: NEGATIVE
Specific Gravity, Urine: 1.01 (ref 1.000–1.030)
Total Protein, Urine: NEGATIVE
Urine Glucose: NEGATIVE
Urobilinogen, UA: 1 (ref 0.0–1.0)
pH: 7 (ref 5.0–8.0)

## 2024-08-12 LAB — COMPREHENSIVE METABOLIC PANEL WITH GFR
ALT: 14 U/L (ref 0–35)
AST: 17 U/L (ref 0–37)
Albumin: 4.2 g/dL (ref 3.5–5.2)
Alkaline Phosphatase: 55 U/L (ref 39–117)
BUN: 12 mg/dL (ref 6–23)
CO2: 26 meq/L (ref 19–32)
Calcium: 8.8 mg/dL (ref 8.4–10.5)
Chloride: 106 meq/L (ref 96–112)
Creatinine, Ser: 0.64 mg/dL (ref 0.40–1.20)
GFR: 92.94 mL/min (ref >60.00)
Glucose, Bld: 98 mg/dL (ref 70–99)
Potassium: 4 meq/L (ref 3.5–5.1)
Sodium: 139 meq/L (ref 135–145)
Total Bilirubin: 0.8 mg/dL (ref 0.2–1.2)
Total Protein: 6.8 g/dL (ref 6.0–8.3)

## 2024-08-12 LAB — LIPID PANEL
Cholesterol: 130 mg/dL (ref 0–200)
HDL: 40.4 mg/dL (ref >39.00)
LDL Cholesterol: 69 mg/dL (ref 0–99)
NonHDL: 89.68
Total CHOL/HDL Ratio: 3
Triglycerides: 105 mg/dL (ref 0.0–149.0)
VLDL: 21 mg/dL (ref 0.0–40.0)

## 2024-08-12 LAB — CBC WITH DIFFERENTIAL/PLATELET
Basophils Absolute: 0 K/uL (ref 0.0–0.1)
Basophils Relative: 0.6 % (ref 0.0–3.0)
Eosinophils Absolute: 0.2 K/uL (ref 0.0–0.7)
Eosinophils Relative: 2.8 % (ref 0.0–5.0)
HCT: 41 % (ref 36.0–46.0)
Hemoglobin: 13.7 g/dL (ref 12.0–15.0)
Lymphocytes Relative: 30.1 % (ref 12.0–46.0)
Lymphs Abs: 1.7 K/uL (ref 0.7–4.0)
MCHC: 33.5 g/dL (ref 30.0–36.0)
MCV: 90.5 fl (ref 78.0–100.0)
Monocytes Absolute: 0.4 K/uL (ref 0.1–1.0)
Monocytes Relative: 6.1 % (ref 3.0–12.0)
Neutro Abs: 3.5 K/uL (ref 1.4–7.7)
Neutrophils Relative %: 60.4 % (ref 43.0–77.0)
Platelets: 183 K/uL (ref 150.0–400.0)
RBC: 4.52 Mil/uL (ref 3.87–5.11)
RDW: 13 % (ref 11.5–15.5)
WBC: 5.8 K/uL (ref 4.0–10.5)

## 2024-08-12 LAB — TSH: TSH: 4 u[IU]/mL (ref 0.35–5.50)

## 2024-08-18 ENCOUNTER — Ambulatory Visit: Payer: Self-pay | Admitting: Internal Medicine

## 2024-08-19 ENCOUNTER — Ambulatory Visit: Admitting: Internal Medicine

## 2024-08-19 ENCOUNTER — Encounter: Admitting: Internal Medicine

## 2024-08-19 ENCOUNTER — Encounter: Payer: Self-pay | Admitting: Internal Medicine

## 2024-08-19 VITALS — BP 140/85 | HR 74 | Temp 97.9°F | Ht 65.0 in | Wt 146.0 lb

## 2024-08-19 DIAGNOSIS — Z0000 Encounter for general adult medical examination without abnormal findings: Secondary | ICD-10-CM

## 2024-08-19 DIAGNOSIS — Z23 Encounter for immunization: Secondary | ICD-10-CM

## 2024-08-19 MED ORDER — ATORVASTATIN CALCIUM 80 MG PO TABS: 80.0000 mg | ORAL_TABLET | Freq: Every day | ORAL | 3 refills | Status: AC

## 2024-08-19 MED ORDER — ALPRAZOLAM 0.25 MG PO TABS: ORAL_TABLET | ORAL | 3 refills | Status: AC

## 2024-08-19 NOTE — Assessment & Plan Note (Addendum)
  We discussed age appropriate health related issues, including available/recomended screening tests and vaccinations. Labs were ordered to be later reviewed . All questions were answered. We discussed one or more of the following - seat belt use, use of sunscreen/sun exposure exercise, safe sex, fall risk reduction, second hand smoke exposure, firearm use and storage, seat belt use, a need for adhering to healthy diet and exercise. Labs were ordered or discussed if they are available. All questions were answered. Shingrix 2021 Cardiac CT score test offered 2020 Colon 2017, 2023 4/21 CT coronary calcium  score of 471. This was 67 percentile for age and sex matched control. Simvastatin  Aspirin  Advised to get a tDAP and Prevnar 20 Taking care of 2 grandkids

## 2024-08-19 NOTE — Progress Notes (Unsigned)
 Subjective:  Patient ID: Sheila Harrington, female    DOB: 1960-11-20  Age: 63 y.o. MRN: [id number]  CC: Annual Exam (Physical; )   HPI Sheila Harrington presents for a well exam  Outpatient Medications Prior to Visit  Medication Sig Dispense Refill  . acetaminophen (TYLENOL) 325 MG tablet Take 650 mg by mouth every 6 (six) hours as needed.    . Ascorbic Acid (VITAMIN C PO) Take by mouth.    . Aspirin  81 MG CAPS Take 1 capsule by mouth daily at 6 PM.    . Cholecalciferol (VITAMIN D3) 50 MCG (2000 UT) capsule Take 1 capsule (2,000 Units total) by mouth daily. 100 capsule 3  . Cyanocobalamin (VITAMIN B-12 PO) Take by mouth.    . tretinoin (RETIN-A) 0.025 % cream Apply topically.    . ALPRAZolam  (XANAX ) 0.25 MG tablet TAKE ONE TABLET TWICE DAILY AS NEEDED FOR ANXIETY 30 tablet 3  . atorvastatin  (LIPITOR) 80 MG tablet TAKE ONE TABLET DAILY 90 tablet 3  . zinc gluconate 50 MG tablet Take 50 mg by mouth daily. (Patient not taking: Reported on 08/19/2024)     No facility-administered medications prior to visit.    ROS: Review of Systems  Constitutional:  Negative for activity change, appetite change, chills, fatigue and unexpected weight change.  HENT:  Negative for congestion, mouth sores and sinus pressure.   Eyes:  Negative for visual disturbance.  Respiratory:  Negative for cough and chest tightness.   Gastrointestinal:  Negative for abdominal pain and nausea.  Genitourinary:  Negative for difficulty urinating, frequency and vaginal pain.  Musculoskeletal:  Positive for arthralgias. Negative for back pain and gait problem.  Skin:  Negative for pallor and rash.  Neurological:  Negative for dizziness, tremors, weakness, numbness and headaches.  Psychiatric/Behavioral:  Negative for confusion, sleep disturbance and suicidal ideas.     Objective:  BP (!) 140/85   Pulse 74   Temp 97.9 F (36.6 C)   Ht 5' 5 (1.651 m)   Wt 146 lb (66.2 kg)   SpO2 98%   BMI 24.30 kg/m   BP  Readings from Last 3 Encounters:  08/19/24 (!) 140/85  08/14/23 122/80  08/06/22 124/76    Wt Readings from Last 3 Encounters:  08/19/24 146 lb (66.2 kg)  08/14/23 142 lb (64.4 kg)  08/06/22 147 lb (66.7 kg)    Physical Exam Constitutional:      General: She is not in acute distress.    Appearance: She is well-developed.  HENT:     Head: Normocephalic.     Right Ear: External ear normal.     Left Ear: External ear normal.     Nose: Nose normal.  Eyes:     General:        Right eye: No discharge.        Left eye: No discharge.     Conjunctiva/sclera: Conjunctivae normal.     Pupils: Pupils are equal, round, and reactive to light.  Neck:     Thyroid : No thyromegaly.     Vascular: No JVD.     Trachea: No tracheal deviation.  Cardiovascular:     Rate and Rhythm: Normal rate and regular rhythm.     Heart sounds: Normal heart sounds.  Pulmonary:     Effort: No respiratory distress.     Breath sounds: No stridor. No wheezing.  Abdominal:     General: Bowel sounds are normal. There is no distension.     Palpations:  Abdomen is soft. There is no mass.     Tenderness: There is no abdominal tenderness. There is no guarding or rebound.  Musculoskeletal:        General: No tenderness.     Cervical back: Normal range of motion and neck supple. No rigidity.  Lymphadenopathy:     Cervical: No cervical adenopathy.  Skin:    Findings: No erythema or rash.  Neurological:     Cranial Nerves: No cranial nerve deficit.     Motor: No abnormal muscle tone.     Coordination: Coordination normal.     Deep Tendon Reflexes: Reflexes normal.  Psychiatric:        Behavior: Behavior normal.        Thought Content: Thought content normal.        Judgment: Judgment normal.     Lab Results  Component Value Date   WBC 5.8 08/12/2024   HGB 13.7 08/12/2024   HCT 41.0 08/12/2024   PLT 183.0 08/12/2024   GLUCOSE 98 08/12/2024   CHOL 130 08/12/2024   TRIG 105.0 08/12/2024   HDL 40.40  08/12/2024   LDLDIRECT 177.3 02/01/2012   LDLCALC 69 08/12/2024   ALT 14 08/12/2024   AST 17 08/12/2024   NA 139 08/12/2024   K 4.0 08/12/2024   CL 106 08/12/2024   CREATININE 0.64 08/12/2024   BUN 12 08/12/2024   CO2 26 08/12/2024   TSH 4.00 08/12/2024    US  RENAL Result Date: 08/19/2023 CLINICAL DATA:  Recurrent urinary tract infection EXAM: RENAL / URINARY TRACT ULTRASOUND COMPLETE COMPARISON:  None Available. FINDINGS: Right Kidney: Renal measurements: 10.4 x 3.9 x 4.5 cm = volume: 93 mL. Echogenicity within normal limits. No mass or hydronephrosis visualized. Left Kidney: Renal measurements: 11.2 x 4.8 x 5.4 cm = volume: 152 mL. Echogenicity within normal limits. No mass or hydronephrosis visualized. Bladder: Appears normal for degree of bladder distention. Other: None. IMPRESSION: No hydronephrosis. Electronically Signed   By: Bard Moats M.D.   On: 08/19/2023 15:59    Assessment & Plan:   Problem List Items Addressed This Visit     Well adult exam    We discussed age appropriate health related issues, including available/recomended screening tests and vaccinations. Labs were ordered to be later reviewed . All questions were answered. We discussed one or more of the following - seat belt use, use of sunscreen/sun exposure exercise, safe sex, fall risk reduction, second hand smoke exposure, firearm use and storage, seat belt use, a need for adhering to healthy diet and exercise. Labs were ordered or discussed if they are available. All questions were answered. Shingrix 2021 Cardiac CT score test offered 2020 Colon 2017 4/21 CT coronary calcium  score of 471. This was 69 percentile for age and sex matched control. Simvastatin  Aspirin  Advised to get a tDAP and Prevnar 20 Taking care of 2 grandkids      Relevant Medications   ALPRAZolam  (XANAX ) 0.25 MG tablet   atorvastatin  (LIPITOR) 80 MG tablet   Other Visit Diagnoses       Encounter for immunization    -  Primary    Relevant Orders   Flu vaccine HIGH DOSE PF(Fluzone Trivalent) (Completed)         Meds ordered this encounter  Medications  . ALPRAZolam  (XANAX ) 0.25 MG tablet    Sig: TAKE ONE TABLET TWICE DAILY AS NEEDED FOR ANXIETY    Dispense:  30 tablet    Refill:  3  . atorvastatin  (LIPITOR) 80  MG tablet    Sig: Take 1 tablet (80 mg total) by mouth daily.    Dispense:  90 tablet    Refill:  3      Follow-up: Return in about 1 year (around 08/19/2025) for a follow-up visit.  Marolyn Noel, MD
